# Patient Record
Sex: Female | Born: 1940
Health system: Southern US, Community
[De-identification: ages and names within clinical notes are randomized; demographics above are authoritative.]

## PROBLEM LIST (undated history)

## (undated) DIAGNOSIS — F32A Depression, unspecified: Secondary | ICD-10-CM

## (undated) DIAGNOSIS — R51 Headache: Secondary | ICD-10-CM

## (undated) DIAGNOSIS — C801 Malignant (primary) neoplasm, unspecified: Secondary | ICD-10-CM

## (undated) DIAGNOSIS — M8580 Other specified disorders of bone density and structure, unspecified site: Secondary | ICD-10-CM

## (undated) DIAGNOSIS — D249 Benign neoplasm of unspecified breast: Secondary | ICD-10-CM

## (undated) DIAGNOSIS — F329 Major depressive disorder, single episode, unspecified: Secondary | ICD-10-CM

## (undated) DIAGNOSIS — E785 Hyperlipidemia, unspecified: Secondary | ICD-10-CM

## (undated) DIAGNOSIS — E039 Hypothyroidism, unspecified: Secondary | ICD-10-CM

## (undated) DIAGNOSIS — R519 Headache, unspecified: Secondary | ICD-10-CM

## (undated) HISTORY — PX: TOTAL HIP ARTHROPLASTY: SHX124

## (undated) HISTORY — PX: HEMORROIDECTOMY: SUR656

## (undated) HISTORY — PX: ABDOMINAL HYSTERECTOMY: SHX81

## (undated) HISTORY — PX: CATARACT EXTRACTION: SUR2

## (undated) HISTORY — DX: Major depressive disorder, single episode, unspecified: F32.9

## (undated) HISTORY — PX: CHOLECYSTECTOMY: SHX55

## (undated) HISTORY — DX: Depression, unspecified: F32.A

## (undated) HISTORY — PX: THYROIDECTOMY: SHX17

## (undated) HISTORY — PX: JOINT REPLACEMENT: SHX530

## (undated) HISTORY — DX: Other specified disorders of bone density and structure, unspecified site: M85.80

## (undated) HISTORY — DX: Hyperlipidemia, unspecified: E78.5

---

## 2000-07-17 ENCOUNTER — Encounter: Payer: Self-pay | Admitting: Family Medicine

## 2000-07-17 ENCOUNTER — Ambulatory Visit (HOSPITAL_COMMUNITY): Admission: RE | Admit: 2000-07-17 | Discharge: 2000-07-17 | Payer: Self-pay | Admitting: Family Medicine

## 2002-08-18 ENCOUNTER — Ambulatory Visit (HOSPITAL_COMMUNITY): Admission: RE | Admit: 2002-08-18 | Discharge: 2002-08-18 | Payer: Self-pay | Admitting: Family Medicine

## 2002-08-18 ENCOUNTER — Encounter: Payer: Self-pay | Admitting: Family Medicine

## 2002-08-22 ENCOUNTER — Encounter: Payer: Self-pay | Admitting: Emergency Medicine

## 2002-08-22 ENCOUNTER — Emergency Department (HOSPITAL_COMMUNITY): Admission: EM | Admit: 2002-08-22 | Discharge: 2002-08-22 | Payer: Self-pay | Admitting: Emergency Medicine

## 2004-03-20 ENCOUNTER — Other Ambulatory Visit: Admission: RE | Admit: 2004-03-20 | Discharge: 2004-03-20 | Payer: Self-pay | Admitting: Family Medicine

## 2005-07-22 ENCOUNTER — Ambulatory Visit: Payer: Self-pay | Admitting: Physical Medicine & Rehabilitation

## 2005-07-22 ENCOUNTER — Inpatient Hospital Stay (HOSPITAL_COMMUNITY): Admission: EM | Admit: 2005-07-22 | Discharge: 2005-07-27 | Payer: Self-pay | Admitting: Emergency Medicine

## 2005-07-23 ENCOUNTER — Encounter (INDEPENDENT_AMBULATORY_CARE_PROVIDER_SITE_OTHER): Payer: Self-pay | Admitting: *Deleted

## 2005-07-27 ENCOUNTER — Inpatient Hospital Stay (HOSPITAL_COMMUNITY)
Admission: RE | Admit: 2005-07-27 | Discharge: 2005-08-04 | Payer: Self-pay | Admitting: Physical Medicine & Rehabilitation

## 2006-07-19 ENCOUNTER — Encounter: Admission: RE | Admit: 2006-07-19 | Discharge: 2006-07-19 | Payer: Self-pay | Admitting: Specialist

## 2007-08-29 ENCOUNTER — Other Ambulatory Visit: Admission: RE | Admit: 2007-08-29 | Discharge: 2007-08-29 | Payer: Self-pay | Admitting: Family Medicine

## 2010-02-14 ENCOUNTER — Emergency Department (HOSPITAL_BASED_OUTPATIENT_CLINIC_OR_DEPARTMENT_OTHER): Admission: EM | Admit: 2010-02-14 | Discharge: 2010-02-14 | Payer: Self-pay | Admitting: Emergency Medicine

## 2010-02-14 ENCOUNTER — Ambulatory Visit: Payer: Self-pay | Admitting: Diagnostic Radiology

## 2010-10-31 NOTE — Op Note (Signed)
NAMEARMINE, RIZZOLO                 ACCOUNT NO.:  [id number]   MEDICAL RECORD NO.:  [id number]          PATIENT TYPE:  INP   LOCATION:  5024                         FACILITY:  [facility]   PHYSICIAN:  Jene Every, M.D.    DATE OF BIRTH:  [birth year]   DATE OF PROCEDURE:  DATE OF DISCHARGE:                                 OPERATIVE REPORT   Audio too short to transcribe (less than 5 seconds)      Jene Every, M.D.     Cordelia Pen  D:  07/23/2005  T:  07/23/2005  Job:  [id number]

## 2010-10-31 NOTE — Discharge Summary (Signed)
[name], [name] NO.:  [id number]   MEDICAL RECORD NO.:  [id number]          PATIENT TYPE:  IPS   LOCATION:  4010                         FACILITY:  [facility]   PHYSICIAN:  [name], M.D.   DATE OF BIRTH:  [birth year]   DATE OF ADMISSION:  07/27/2005  DATE OF DISCHARGE:  08/04/2005                                 DISCHARGE SUMMARY   DISCHARGE DIAGNOSES:  1.  Left femoral neck fracture status post hemiarthroplasty on July 23, 2005.  2.  Pain management.  3.  Coumadin for deep venous thrombosis prophylaxis.  4.  Postoperative anemia.  5.  Depression.  6.  Hypothyroidism.   HISTORY OF PRESENT ILLNESS:  The patient is a 70 year old white female  admitted July 22, 2005 after a fall without loss of consciousness,  sustaining a left femoral neck fracture.  Underwent left hip  hemiarthroplasty July 23, 2005 per Dr. Shelle Shelton.  Placed on Coumadin for  deep venous thrombosis prophylaxis, 50% partial weightbearing.  Postoperative anemia, 8.5; transfused 2 units of packed red blood cells on  July 26, 2005, with hemoglobin improved to 11.1.  No chest pain, no  nausea or vomiting.  The patient was admitted to inpatient rehabilitation  services.   PAST MEDICAL HISTORY:  See discharge diagnoses.   SOCIAL HISTORY:  No alcohol or tobacco.  She lives alone.  Works as a  Publishing copy.   ALLERGIES:  SULFA.   PLAN:  Home with [name] in [place] on discharge.  [name] works part-  time.   MEDICATIONS PRIOR TO ADMISSION:  1.  Prozac 20 mg daily.  2.  Trazodone 50 mg daily.  3.  Wellbutrin 300 mg daily.  4.  Thyroid 112 mcg daily.  5.  Premarin 0.45 mg daily.   HOSPITAL COURSE:  The patient was admitted to inpatient rehabilitation  services, with therapies initiated on a b.i.d. basis, consisting of physical  therapy, occupational therapy, and rehabilitation nursing.  The following  issues were addressed during the patient's rehabilitation  stay.  Pertaining  to [name]'s left femoral neck fracture, hip hemiarthroplasty July 23, 2005 per Dr. Shelle Shelton.  Surgical site healing nicely.  No signs of infection.  She continued to follow her posterior hip precautions.  She was 50% partial  weightbearing.  She remained on Coumadin for deep venous thrombosis  prophylaxis, with latest INR of 1.7 and monitored.  Her calfs remained cool,  without any swelling, erythema, and nontender.  She remained on oxycodone as  well as Robaxin for pain management, with good results.  Postoperative  anemia stable, with latest hemoglobin of 11.4, hematocrit 33.8.  She did  have a documented history of depression.  She remained on her Prozac,  Wellbutrin, and Trazodone.  She remained upbeat, cooperative with staff,  resting well at night.  Had a good appetite.  She continued on hormone  supplement for hypothyroidism.  Followup maintenance thyroid panel was  pending.  Overall for her functional status, she was ambulating extended  household distances with a walker.  Essentially independent to standby  assist in all areas of activities of daily living, addressing grooming and  homemaking.  Overall, her strength and endurance greatly improved.  She was  encouraged with her overall progress.  OxyContin sustained release had been  added to her regimen and weaned over a one-week period at the time of  discharge.  She was discharged in stable condition.   DISCHARGE MEDICATIONS:  1.  Coumadin 6 mg daily, with dose adjusted accordingly.  She would remain      on Coumadin until August 20, 2005 and stop.  Followed by home health      agency.  2.  Premarin 0.45 mg daily.  3.  Prozac 20 mg daily.  4.  Wellbutrin 300 mg daily.  5.  Synthroid 112 mcg daily.  6.  Trinsicon one capsule twice daily.  7.  Desyrel 50 mg at bedtime.  8.  OxyContin sustained release, 10 mg Shelton 12 hours x1 week and then stop.  9.  Oxycodone immediate release for breakthrough pain.   10. Os-Cal 1000 mg daily.  11. Robaxin 500 mg Shelton six hours as needed.   ACTIVITY:  Partial weightbearing with posterior hip precautions.   DIET:  Regular.   WOUND CARE:  Cleanse incision daily with warm soap and water.   SPECIAL INSTRUCTIONS:  Home health nurse to complete Coumadin protocol.  Home health, physical, and occupational therapy.   FOLLOW UP:  Follow up with Dr. Shelle Shelton in orthopedic services.  Call for  appointment.      [name], P.A.    ______________________________  [name], M.D.    DA/MEDQ  D:  08/03/2005  T:  08/03/2005  Job:  [id number]   cc:   [name], M.D.  Fax: [phone]   [name], M.D.  Fax: [phone]   [name], M.D.  Fax: [phone]

## 2010-10-31 NOTE — H&P (Signed)
[name], [name] NO.:  [id number]   MEDICAL RECORD NO.:  [id number]          PATIENT TYPE:  IPS   LOCATION:  4010                         FACILITY:  [facility]   PHYSICIAN:  Ranelle Oyster, M.D.DATE OF BIRTH:  1940-07-08   DATE OF ADMISSION:  07/27/2005  DATE OF DISCHARGE:                                HISTORY & PHYSICAL   CHIEF COMPLAINT:  Left femoral neck fracture.   HISTORY OF PRESENT ILLNESS:  This is a 70 year old white female status post  fall on February 7 where she sustained a left femoral neck fracture.  Patient underwent a left hip hemiarthroplasty on February 8 by Dr. Shelle Iron.  She was placed on Coumadin for DVT prophylaxis.  She was transfused 2 units  postoperatively for hemoglobin 8.5.  INR was 5.4 today.  Coumadin has been  held.  She is admitted to inpatient rehabilitation today for further  functional progress.   REVIEW OF SYSTEMS:  Positive for insomnia and depression.  Full review of  systems is in the written history and physical.   PAST MEDICAL HISTORY:  1.  Depression.  2.  Hyperthyroidism.   FAMILY HISTORY:  Positive for coronary artery disease.   SOCIAL HISTORY:  Patient lives alone and works as a Control and instrumentation engineer person.  She will go home with her daughter in [place] upon discharge.  The  daughter works part-time.  The patient was completely independent prior to  arrival.   MEDICATIONS PRIOR TO ARRIVAL:  Prozac, trazodone, Wellbutrin, thyroid,  Premarin.   LABORATORIES:  Hemoglobin 11.1, white count 8.3, platelets 321.  Sodium 136,  potassium 3.4, BUN and creatinine 5 and 0.8, INR 5.4 today.   PHYSICAL EXAMINATION:  VITAL SIGNS:  Blood pressure 116/63, pulse 84,  respiratory rate 20, temperature 98.2.  GENERAL:  Patient is pleasant, in no acute distress.  She is alert and  oriented x3.  HEENT:  Unremarkable.  Oral mucosa is pink and moist.  Pupils are equal,  round, and reactive to light and accommodation.  Extraocular  movements are  intact.  NECK:  Supple without JVD and lymphadenopathy.  CHEST:  Clear to auscultation bilaterally without wheezes, rales, and  rhonchi.  HEART:  Regular rate and rhythm without murmurs, rubs, or gallops.  ABDOMEN:  Soft, nontender.  Bowel sounds are positive.  NEUROLOGIC:  Cranial nerves II-XII are intact.  Reflexes are 2+.  Sensation  is normal.  Judgment, orientation, memory, and mood are all within normal  limits.  Motor function is 5/5 in the upper extremities.  Left lower  extremity is 2/5 proximally to 4/5 distally.  Right lower extremity is 4/5  to 5/5 throughout.  Bilateral calves are nontender.  Legs are non-swollen  distally.  Left hip is clean and intact with minimal serosanguineous  discharge.   IMPRESSION/PLAN:  1.  Functional deficit secondary to left femoral neck fracture and hip      hemiarthroplasty postoperative day #4.  Begin comprehensive inpatient      rehabilitation with PT to assess for mobility, lower extremity strength      and balance and  OT will assess for ADLs and upper extremity use.      Nursing will follow for medications.  Bowel, bladder, skin management.      Nurse/case manager/social worker will assess for psychosocial needs.      Estimated length of stay is seven days.  Prognosis is good.  Goals are      modified independent.  2.  Pain management.  Oxycodone IR p.r.n.  Robaxin p.r.n.  3.  DVT prophylaxis.  Hold Coumadin today and follow-up INR in a.m.  4.  Postoperative anemia.  Follow-up CBC.  5.  Depression.  Prozac, Wellbutrin, trazodone.  6.  Hypothyroidism.  Synthroid.  7.  Bowels/constipation.  Sorbitol today with daily Senokot S ordered and      p.r.n. Dulcolax ordered as well.      Ranelle Oyster, M.D.  Electronically Signed     ZTS/MEDQ  D:  07/27/2005  T:  07/27/2005  Job:  [id number]

## 2010-10-31 NOTE — Discharge Summary (Signed)
NAMEJACKIE, [name]                 ACCOUNT NO.:  [id number]   MEDICAL RECORD NO.:  [id number]          PATIENT TYPE:  INP   LOCATION:  5732                         FACILITY:  [facility]   PHYSICIAN:  Jene Every, M.D.    DATE OF BIRTH:  [birth year]   DATE OF ADMISSION:  07/22/2005  DATE OF DISCHARGE:  07/27/2005                                 DISCHARGE SUMMARY   ADMISSION DIAGNOSES:  1.  Left femoral neck fracture.  2.  History of papillary cancer.  3.  Thyroidectomy.  4.  Depression.   DISCHARGE DIAGNOSES:  1.  Left femoral neck fracture.  2.  History of papillary cancer.  3.  Thyroidectomy.  4.  Depression.  5.  Status post left hip hemiarthroplasty.   PROCEDURE:  The patient was taken to the OR on July 24, 2005 to undergo a  left hip hemiarthroplasty.   SURGEON:  Jene Every, M.D.   ASSISTANT:  Roma Schanz, P.A.-C.   ANESTHESIA:  General.   COMPLICATIONS:  None.   HISTORY:  Ms. Ron is a pleasant 70 year old female who had a fall at her  home.  She was brought by EMS to [facility] Emergency Room where x-rays did  reveal left femoral neck fracture.  She was evaluated by Dr. Shelle Iron in the  emergency room and it was felt that she would need a left hip  hemiarthroplasty.  The patient was admitted for preoperative workup.  The  risks and benefits of the surgery were discussed with the patient as well as  her family and they did agree to proceed.  Preoperative labs were obtained.  CBC shows an elevated white count of 13.0, hemoglobin 12.8, hematocrit 38.2.  These are monitored throughout the hospital course.  The patient did drop to  a lowest of 8.5, hematocrit of 25.2.  However at time of discharge to rehab,  hemoglobin had improved to the 11.1, hematocrit was 32.5.  Coagulation done  preoperatively showed PT 12.5, INR 0.9.  These were followed throughout the  hospital course.  Patient did have a quick response to Coumadin.  At time of  discharge to rehab, PT was  49.1, INR 5.4.  Routine chemistries obtained  preoperatively show sodium 136, potassium 4.0, slightly elevated glucose of  120.  These are followed throughout the hospital course.  Patient's sodium  did drop to the level of 128, but at the time of discharge to rehab had  resolved to level of 136, potassium at the time of discharge was 3.4.  Routine liver function tests were normal.  Preoperative urinalysis was  remarkable for leukocyte esterase is 0-2 WBC seen per high power field.  There were numerous bacteria.  Blood type is O positive.  I do not see  preoperative chest x-ray or EKG listed in the chart.   HOSPITAL COURSE:  The patient was admitted and underwent left hip  hemiarthroplasty without difficulty.  She was then transferred to the PICU  and then to the recovery room and then to the orthopedic floor for continued  postoperative care.  Patient did fairly well postoperatively.  She did have  some issues with pain control.  Patient was placed on Coumadin for DVT  prophylaxis.  Discharge planning was initiated.  PT/OT was consulted.  Patient did develop some postoperative anemia, mildly symptomatic.  This was  monitored.  Incision remained clean and dry and packed throughout the  hospital stay.  Lovenox was began on postoperative day #2 until INR was  therapeutic.  Patient was transfused on postoperative day #3, 2 units of  packed red blood cells.  On that, she did note significant improvement.  She  did feel much stronger.  She continued to advanced very slowly with physical  therapy.  Patient's INR did spike and Lovenox withheld.  On postoperative  day #5, she was transferred to the rehab unit.   DISPOSITION:  Patient transferred to rehab unit for continued management for  multiple medical needs as well as physical therapy.  She is to have followup  with Dr. Shelle Iron in approximately 2 weeks for suture removal as well as x-  rays.   DISCHARGE MEDICATIONS:  As per rehab.  Continue  followup on her hemoglobin  as well as close monitoring of her Coumadin levels.   CONDITION ON DISCHARGE:  Stable.   FINAL DIAGNOSIS:  Status post left hip hemiarthroplasty.      Roma Schanz, P.A.      Jene Every, M.D.  Electronically Signed    CS/MEDQ  D:  01/01/2006  T:  01/01/2006  Job:  [id number]

## 2010-10-31 NOTE — Op Note (Signed)
NAMETAKIMA, ENCINA NO.:  [id number]   MEDICAL RECORD NO.:  [id number]          PATIENT TYPE:  INP   LOCATION:  5024                         FACILITY:  [facility]   PHYSICIAN:  Jene Every, M.D.    DATE OF BIRTH:  [birth year]   DATE OF PROCEDURE:  07/23/2005  DATE OF DISCHARGE:                                 OPERATIVE REPORT   PREOPERATIVE DIAGNOSIS:  Displaced femoral neck fracture.   POSTOPERATIVE DIAGNOSIS:  Displaced femoral neck fracture.   OPERATION PERFORMED:  Left hip hemiarthroplasty utilizing the DePuy Prodigy  13.5 mm stem, 44 mm bipolar assembly, short neck.   SURGEON:  Jene Every, M.D.   ASSISTANT:  Roma Schanz, P.A.   ANESTHESIA:  General.   INDICATIONS FOR PROCEDURE:  This is a 70 year old female who was seen  yesterday following a fall onto her left hip sustaining a displaced femoral  neck fracture.  The displaced femoral neck fracture was not viable and we  discussed with her the hemiarthroplasty procedure being required.  She had  radiographs demonstrating good acetabular space, therefore, did not feel a  total hip replacement was indicated.  The patient was otherwise healthy.  I  discussed the risks and benefits of the procedure including bleeding,  infection, damage to neurovascular structures, suboptimal range of motion,  dislocation, need for revision, persistent thigh pain, hardware failure,  anesthetic complications, DVT, PE, etc, and need for anticoagulation as well  as possibility of leg length discrepancy.   DESCRIPTION OF PROCEDURE:  Placed supine position.  After induction of  adequate anesthesia and 1 g Kefzol, she was placed in the right lateral  decubitus position.  All bony prominences were well padded.  The down leg  was padded and gently flexed and secured.  We utilized the hip restrainer.  The left lower extremity and the hip was prepped and draped in the usual  sterile fashion.  Posterolateral approach to the  hip was made with an  incision based over the trochanter through the skin only.  Subcutaneous  tissue was dissected.  Electrocautery utilized to achieved hemostasis.  Fascia lata was identified and divided in line with skin incision.  The  gluteus was separated a small portion away from the tip of the trochanter.  The patient had fair subcutaneous adipose tissue. Therefore will require the  utilization of the Charnley.  I placed a wet sponge over the soft tissue to  protect it, utilization of the Charnley.  This was kept underneath the  fascia lata only.  Protecting the sciatic nerve.  We then performed an  adductor tenotomy.  We identified the external rotators.  They were tagged,  detached from their origin and gently swept posteriorly.  Soft tissues  protected at all times with a Deaver.  We then performed a T-shaped  capsulotomy.  Fair amount of fracture hematoma was expressed.  These  leaflets were tagged.  The hip was dislocated.  The ball removed.  It  measured to a 44.  I inspected the fracture.  There appeared to be no  evidence of a pathologic lesion  though she did have a history of thyroid  cancer and we sent that for pathology in any event.  There was a fracture  that had extended to the neck proximally to the ball.  I used a Leksell  rongeur to remove the lateral aspect of the cortex of the femoral neck in  concert with the initiator and a box chisel in the appropriate version to  lateralize the greater trochanter.  Broach entered the canal with a T-handle  reamer, placed the broach, a 10 broach, so I could lateralize the greater  trochanter.  At one fingerbreadth above the lesser trochanter, completed the  osteotomy with an oscillating saw.  The fracture itself was near at this  level at least anteriorly.  Calcar appeared to be intact.  With the  appropriate lateralization, the appropriate version of the femur, we reamed  to a 13 mm diameter.  At the 13 mm diameter there was a  bit of cortical bone  obtained on the reamer.  It was felt then that a 13.5 would be optimal.  The  femur was then sequentially broached to a 13.5 then used a calcar reamer to  ream the calcar.  This was then removed.  We attempted a trial.  Having some  difficulty reducing the trial.  Therefore extended the skin incision  somewhat and performed a slight tenotomy of the gluteus insertion with the  sciatic nerve tissues well protected.  That in concert with the addition of  muscle relaxing was then satisfactory.  I obtained the permanent prosthesis  13.5 Prodigy porous coated impacting the appropriate version with excellent  scratch fit obtained.  This was to the level of the calcar.  Then a trial  +044 was found to reduce, be stable through out a full range.  Obtained full  extension with the knee flexed internal-external rotation without  dislocation.  There was no instability noted.  Leg lengths were appropriate.  Then redislocated and obtained the permanent bipolar assembly.  Cleaned the  trunnion and impacted bipolar assembly upon the neck.  Again with the soft  tissues well protected we reduced the hip. It was found to be stable  throughout a full range.  She had a full extension.  Good flexion with  internal rotation without dislocation.  Prior to this, we had copiously  irrigated with antibiotic irrigation, pulsatile lavage throughout the case.  This was done in the canal into the acetabulum.  We had inspected the  acetabulum prior to the reduction. It was debrided, there was good  cartilage.  There was no evidence of osteoarthritis.  We achieved strict  hemostasis for that.  Next we inspected, no evidence of active bleeding.  Repaired the adductor tenotomy with #1 Vicryl interrupted figure-of-eight  sutures.  Unable to repair the external rotators.  These were then released.  Palpated the sciatic nerve again, it was intact.  With the Deaver protecting the tissues repaired the  capsule with #1 Ethibond interrupted figure-of-  eight sutures.  Again no active bleeding and therefore repaired the fascia  lata with the leg supported, #1 Vicryl interrupted figure-of-eight sutures.  Subcutaneous tissue was approximately multiple layers of 0 and 2-0 Vicryl  simple sutures.  Skin was reapproximated with staples again with no active  bleeding.  We irrigated multiple layers throughout the closure.  Wound was  dressed sterilely.  Patient was placed therefore supine on the hospital bed.  Leg lengths were measured as equivalent, extubated without difficulty,  transported to  the recovery room in satisfactory condition.   The patient tolerated the procedure well.  No complications.  Blood loss 200  mL.      Jene Every, M.D.  Electronically Signed     JB/MEDQ  D:  07/23/2005  T:  07/23/2005  Job:  [id number]

## 2011-04-10 ENCOUNTER — Other Ambulatory Visit (HOSPITAL_BASED_OUTPATIENT_CLINIC_OR_DEPARTMENT_OTHER): Payer: Self-pay | Admitting: Family Medicine

## 2011-04-10 DIAGNOSIS — Z1231 Encounter for screening mammogram for malignant neoplasm of breast: Secondary | ICD-10-CM

## 2011-04-14 ENCOUNTER — Ambulatory Visit (HOSPITAL_BASED_OUTPATIENT_CLINIC_OR_DEPARTMENT_OTHER)
Admission: RE | Admit: 2011-04-14 | Discharge: 2011-04-14 | Disposition: A | Discharge status: Home / Self Care | Payer: Medicare Other | Source: Ambulatory Visit | Attending: Family Medicine | Admitting: Family Medicine

## 2011-04-14 DIAGNOSIS — Z1231 Encounter for screening mammogram for malignant neoplasm of breast: Secondary | ICD-10-CM

## 2011-04-22 ENCOUNTER — Other Ambulatory Visit: Payer: Self-pay | Admitting: Family Medicine

## 2011-04-22 DIAGNOSIS — R928 Other abnormal and inconclusive findings on diagnostic imaging of breast: Secondary | ICD-10-CM

## 2011-04-23 ENCOUNTER — Ambulatory Visit
Admission: RE | Admit: 2011-04-23 | Discharge: 2011-04-23 | Disposition: A | Discharge status: Home / Self Care | Payer: Medicare Other | Source: Ambulatory Visit | Attending: Family Medicine | Admitting: Family Medicine

## 2011-04-23 DIAGNOSIS — R928 Other abnormal and inconclusive findings on diagnostic imaging of breast: Secondary | ICD-10-CM

## 2011-05-15 ENCOUNTER — Other Ambulatory Visit: Payer: Self-pay | Admitting: Family Medicine

## 2011-05-15 DIAGNOSIS — N63 Unspecified lump in unspecified breast: Secondary | ICD-10-CM

## 2011-05-26 ENCOUNTER — Ambulatory Visit
Admission: RE | Admit: 2011-05-26 | Discharge: 2011-05-26 | Disposition: A | Discharge status: Home / Self Care | Payer: Medicare Other | Source: Ambulatory Visit | Attending: Family Medicine | Admitting: Family Medicine

## 2011-05-26 ENCOUNTER — Other Ambulatory Visit: Payer: Self-pay | Admitting: Family Medicine

## 2011-05-26 DIAGNOSIS — N63 Unspecified lump in unspecified breast: Secondary | ICD-10-CM

## 2011-07-21 ENCOUNTER — Emergency Department (HOSPITAL_COMMUNITY): Payer: Medicare Other

## 2011-07-21 ENCOUNTER — Encounter (HOSPITAL_COMMUNITY): Payer: Self-pay | Admitting: Emergency Medicine

## 2011-07-21 ENCOUNTER — Other Ambulatory Visit: Payer: Self-pay

## 2011-07-21 ENCOUNTER — Emergency Department (HOSPITAL_COMMUNITY)
Admission: EM | Admit: 2011-07-21 | Discharge: 2011-07-21 | Disposition: A | Discharge status: Home / Self Care | Payer: Medicare Other | Attending: Emergency Medicine | Admitting: Emergency Medicine

## 2011-07-21 DIAGNOSIS — Z7982 Long term (current) use of aspirin: Secondary | ICD-10-CM | POA: Insufficient documentation

## 2011-07-21 DIAGNOSIS — R0789 Other chest pain: Secondary | ICD-10-CM | POA: Insufficient documentation

## 2011-07-21 DIAGNOSIS — M79609 Pain in unspecified limb: Secondary | ICD-10-CM | POA: Insufficient documentation

## 2011-07-21 DIAGNOSIS — Z79899 Other long term (current) drug therapy: Secondary | ICD-10-CM | POA: Insufficient documentation

## 2011-07-21 DIAGNOSIS — M25519 Pain in unspecified shoulder: Secondary | ICD-10-CM | POA: Insufficient documentation

## 2011-07-21 DIAGNOSIS — E039 Hypothyroidism, unspecified: Secondary | ICD-10-CM | POA: Insufficient documentation

## 2011-07-21 HISTORY — DX: Hypothyroidism, unspecified: E03.9

## 2011-07-21 LAB — POCT I-STAT, CHEM 8
BUN: 21 mg/dL (ref 6–23)
Calcium, Ion: 1.18 mmol/L (ref 1.12–1.32)
Chloride: 107 mEq/L (ref 96–112)
Glucose, Bld: 82 mg/dL (ref 70–99)

## 2011-07-21 LAB — CBC
HCT: 38 % (ref 36.0–46.0)
Platelets: 266 10*3/uL (ref 150–400)
RBC: 4.33 MIL/uL (ref 3.87–5.11)

## 2011-07-21 LAB — DIFFERENTIAL
Basophils Relative: 0 % (ref 0–1)
Eosinophils Absolute: 0.1 10*3/uL (ref 0.0–0.7)
Lymphs Abs: 2.7 10*3/uL (ref 0.7–4.0)
Neutro Abs: 3.4 10*3/uL (ref 1.7–7.7)
Neutrophils Relative %: 48 % (ref 43–77)

## 2011-07-21 LAB — BASIC METABOLIC PANEL
BUN: 20 mg/dL (ref 6–23)
CO2: 24 mEq/L (ref 19–32)
Creatinine, Ser: 1.06 mg/dL (ref 0.50–1.10)
Glucose, Bld: 81 mg/dL (ref 70–99)

## 2011-07-21 NOTE — ED Notes (Signed)
Pt states that she went to walk her dog tonight and afterwards had bilateral arm pain and chest tightness.  States chest tightness is minimal now (1/10) and arm pain is not as severe.  Took 3 full strength ASA at home.  Denies n/v/d.  No SOB.

## 2011-07-21 NOTE — ED Notes (Signed)
rx x 0, pt voiced understanding to f/u with pcp and return for worsening of condition.

## 2011-07-21 NOTE — ED Notes (Signed)
PT. REPORTS CHEST TIGHTNESS / BILATERAL SHOULDER AND ARM PAIN ONSET THIS EVENING , DENIES SOB , SLIGHT NAUSEA , TOOK 3 REG ASA PRIOR TO ARRIVAL.

## 2011-07-21 NOTE — ED Provider Notes (Signed)
History     CSN: [id number]  Arrival date & time 07/21/11  0113   First MD Initiated Contact with Patient 07/21/11 0153      Chief Complaint  Patient presents with  . Chest Pain    (Consider location/radiation/quality/duration/timing/severity/associated sxs/prior treatment) Patient is a 71 y.o. female presenting with chest pain. The history is provided by the patient.  Chest Pain Pertinent negatives for primary symptoms include no shortness of breath, no abdominal pain, no nausea and no vomiting.  Pertinent negatives for associated symptoms include no numbness and no weakness.    patient developed chest tightness and bilateral shoulder pain radiating to both arms this evening. She states she's getting ready for bed after a work. The pain is improved after taking aspirin. No fevers. No nausea vomiting diarrhea. No diaphoresis. No lightheadedness. No cough. She's not had pains like this before. She does not smoke.  Past Medical History  Diagnosis Date  . Hypothyroidism     Past Surgical History  Procedure Date  . Total hip arthroplasty   . Thyroidectomy     No family history on file.  History  Substance Use Topics  . Smoking status: Never Smoker   . Smokeless tobacco: Not on file  . Alcohol Use: No    OB History    Grav Para Term Preterm Abortions TAB SAB Ect Mult Living                  Review of Systems  Constitutional: Negative for activity change and appetite change.  HENT: Negative for neck stiffness.   Eyes: Negative for pain.  Respiratory: Negative for chest tightness and shortness of breath.   Cardiovascular: Positive for chest pain. Negative for leg swelling.  Gastrointestinal: Negative for nausea, vomiting, abdominal pain and diarrhea.  Genitourinary: Negative for flank pain.  Musculoskeletal: Negative for back pain.  Skin: Negative for rash.  Neurological: Negative for weakness, numbness and headaches.  Psychiatric/Behavioral: Negative for behavioral  problems.    Allergies  Sulfa antibiotics  Home Medications   Current Outpatient Rx  Name Route Sig Dispense Refill  . ALENDRONATE SODIUM 70 MG PO TABS Oral Take 70 mg by mouth every 7 (seven) days. Take with a full glass of water on an empty stomach.    . ASPIRIN 325 MG PO TABS Oral Take 975 mg by mouth once.    . BUPROPION HCL ER (XL) 300 MG PO TB24 Oral Take 300 mg by mouth daily.    Marland Kitchen CITALOPRAM HYDROBROMIDE 20 MG PO TABS Oral Take 20 mg by mouth daily.    Marland Kitchen ESTRADIOL 0.5 MG PO TABS Oral Take 0.5 mg by mouth daily.    Marland Kitchen LEVOTHYROXINE SODIUM 112 MCG PO TABS Oral Take 112 mcg by mouth daily.    . TRAZODONE HCL 50 MG PO TABS Oral Take 50 mg by mouth at bedtime.      BP 117/88  Pulse 73  Temp(Src) 97.7 F (36.5 C) (Oral)  Resp 14  SpO2 100%  Physical Exam  Nursing note and vitals reviewed. Constitutional: She is oriented to person, place, and time. She appears well-developed and well-nourished.  HENT:  Head: Normocephalic and atraumatic.  Eyes: EOM are normal. Pupils are equal, round, and reactive to light.  Neck: Normal range of motion. Neck supple.  Cardiovascular: Normal rate, regular rhythm and normal heart sounds.   No murmur heard. Pulmonary/Chest: Effort normal and breath sounds normal. No respiratory distress. She has no wheezes. She has no rales.  Abdominal: Soft. Bowel sounds are normal. She exhibits no distension. There is no tenderness. There is no rebound and no guarding.  Musculoskeletal: Normal range of motion.       Tenderness over bilateral trapezius  Neurological: She is alert and oriented to person, place, and time. No cranial nerve deficit.  Skin: Skin is warm and dry.  Psychiatric: She has a normal mood and affect. Her speech is normal.    ED Course  Procedures (including critical care time)  Labs Reviewed  BASIC METABOLIC PANEL - Abnormal; Notable for the following:    GFR calc non Af Amer 52 (*)    GFR calc Af Amer 60 (*)    All other  components within normal limits  CBC  DIFFERENTIAL  POCT I-STAT, CHEM 8  POCT I-STAT TROPONIN I   Dg Chest 2 View  07/21/2011  *RADIOLOGY REPORT*  Clinical Data: Chest pain  CHEST - 2 VIEW  Comparison: 02/14/2010  Findings: Stable left infrahilar scarring or atelectasis.  Coarse perihilar interstitial markings as before.  No focal infiltrate. No effusion.  No edema.  Heart size normal.  Vascular clips in the upper abdomen and at the thoracic inlet on the left.  Regional bones unremarkable.  IMPRESSION:  1.  Chronic interstitial changes as above.  No acute disease.  Original Report Authenticated By: Thora Lance III, M.D.     1. Chest pain   2. Shoulder pain      Date: 07/21/2011  Rate: 69  Rhythm: normal sinus rhythm  QRS Axis: normal  Intervals: normal  ST/T Wave abnormalities: normal  Conduction Disutrbances:none  Narrative Interpretation:   Old EKG Reviewed: unchanged    MDM  Patient developed bilateral shoulder pain and chest tightness. Pain is improved now. EKG is reassuring and enzymes are negative. X-ray does not show a cause. I doubt cardiac cause. it appears more musculoskeletal in the shoulders. She be discharged home.        Juliet Rude. Rubin Payor, MD 07/21/11 [phone]

## 2012-04-13 ENCOUNTER — Other Ambulatory Visit: Payer: Self-pay | Admitting: Family Medicine

## 2012-04-13 DIAGNOSIS — Z1231 Encounter for screening mammogram for malignant neoplasm of breast: Secondary | ICD-10-CM

## 2012-05-11 ENCOUNTER — Ambulatory Visit: Payer: Medicare Other

## 2012-05-18 ENCOUNTER — Ambulatory Visit
Admission: RE | Admit: 2012-05-18 | Discharge: 2012-05-18 | Disposition: A | Discharge status: Home / Self Care | Payer: Medicare Other | Source: Ambulatory Visit | Attending: Family Medicine | Admitting: Family Medicine

## 2012-05-18 DIAGNOSIS — Z1231 Encounter for screening mammogram for malignant neoplasm of breast: Secondary | ICD-10-CM

## 2013-03-20 ENCOUNTER — Encounter (INDEPENDENT_AMBULATORY_CARE_PROVIDER_SITE_OTHER): Payer: Self-pay | Admitting: General Surgery

## 2013-03-20 ENCOUNTER — Ambulatory Visit (INDEPENDENT_AMBULATORY_CARE_PROVIDER_SITE_OTHER): Payer: Medicare Other | Admitting: General Surgery

## 2013-03-20 VITALS — BP 110/68 | HR 72 | Temp 97.6°F | Resp 14 | Ht 65.0 in | Wt 148.4 lb

## 2013-03-20 DIAGNOSIS — K648 Other hemorrhoids: Secondary | ICD-10-CM

## 2013-03-20 NOTE — Patient Instructions (Addendum)
Use a laxative of choice to try to prevent constipation. Call if you have heavy bleeding.

## 2013-03-20 NOTE — Progress Notes (Signed)
Patient ID: [name], female   DOB: [birth year], 72 y.o.   MRN: [id number]  Chief Complaint  Patient presents with  . New Evaluation    eval hems    HPI [name] is a 72 y.o. female.   HPI  She is referred by Dr. Merri Brunette for further evaluation of a symptomatic hemorrhoid. She has had hemorrhoids for many years. There are times when it swells up and becomes painful. She does have a problem with chronic constipation. No significant bleeding.  Past Medical History  Diagnosis Date  . Hypothyroidism   . Depression   . Hyperlipidemia   . Osteopenia     Past Surgical History  Procedure Laterality Date  . Total hip arthroplasty    . Thyroidectomy    . Abdominal hysterectomy    . Cataract extraction      Family History  Problem Relation Age of Onset  . Cancer Paternal Aunt     breast    Social History History  Substance Use Topics  . Smoking status: Never Smoker   . Smokeless tobacco: Never Used  . Alcohol Use: No    Allergies  Allergen Reactions  . Sulfa Antibiotics Nausea Only    Current Outpatient Prescriptions  Medication Sig Dispense Refill  . buPROPion (WELLBUTRIN XL) 300 MG 24 hr tablet Take 300 mg by mouth daily.      . citalopram (CELEXA) 20 MG tablet Take 20 mg by mouth daily.      Marland Kitchen estradiol (ESTRACE) 0.5 MG tablet Take 0.5 mg by mouth daily.      Marland Kitchen levothyroxine (SYNTHROID, LEVOTHROID) 112 MCG tablet Take 112 mcg by mouth daily.      . traZODone (DESYREL) 50 MG tablet Take 50 mg by mouth at bedtime.      Marland Kitchen aspirin 325 MG tablet Take 975 mg by mouth once.       No current facility-administered medications for this visit.    Review of Systems Review of Systems  Constitutional: Negative.   Cardiovascular: Negative.   Gastrointestinal: Positive for constipation and rectal pain.  Genitourinary: Negative.     Blood pressure 110/68, pulse 72, temperature 97.6 F (36.4 C), temperature source Temporal, resp. rate 14, height 5\' 5"  (1.651 m),  weight 148 lb 6.4 oz (67.314 kg).  Physical Exam Physical Exam  Constitutional: She appears well-developed and well-nourished. No distress.  HENT:  Head: Normocephalic and atraumatic.  Genitourinary:  Anorectal:  There are no external swellings or lesions. No fissure noted. On digital rectal exam there is a fullness noted in the right posterior region.  Anoscopy demonstrates a enlarged internal hemorrhoid in the right posterior position that when the anoscope was removed prolapses. It is easily reducible.  After discussion with her, rubber band ligation was performed which she tolerated well.    Data Reviewed Note from Dr. Katrinka Blazing   Assessment    Prolapsing internal hemorrhoid-rubber band ligation performed.     Plan    Return visit 3 weeks. If it did not respond her band ligation and I told her outpatient surgery would be the next If she wanted to get rid of the hemorrhoid.        Zacharia Sowles J 03/20/2013, 9:56 AM

## 2013-04-12 ENCOUNTER — Ambulatory Visit (INDEPENDENT_AMBULATORY_CARE_PROVIDER_SITE_OTHER): Payer: Medicare Other | Admitting: General Surgery

## 2013-04-12 ENCOUNTER — Encounter (INDEPENDENT_AMBULATORY_CARE_PROVIDER_SITE_OTHER): Payer: Self-pay | Admitting: General Surgery

## 2013-04-12 ENCOUNTER — Encounter (INDEPENDENT_AMBULATORY_CARE_PROVIDER_SITE_OTHER): Payer: Medicare Other | Admitting: General Surgery

## 2013-04-12 VITALS — BP 108/62 | HR 76 | Temp 99.3°F | Resp 14 | Ht 65.0 in | Wt 145.8 lb

## 2013-04-12 DIAGNOSIS — K648 Other hemorrhoids: Secondary | ICD-10-CM

## 2013-04-12 NOTE — Progress Notes (Signed)
Subjective:     Patient ID: [name], female   DOB: 1941-04-27, 72 y.o.   MRN: [id number]  HPI  She's here for followup of her prolapsing internal hemorrhoids. She had rubber band ligation treatment. She feels much better. She's not having any further prolapsing or bleeding.   Review of Systems     Objective:   Physical Exam Anorectal-no external lesions. On digital rectal exam there's a small fullness present in the right posterior area.  Anoscopy demonstrates a significantly smaller internal hemorrhoid that is firm.    Assessment:     Internal prolapsing and bleeding hemorrhoids that's responded fairly well torubber band ligation. Symptoms have resolved. Hemorrhoid is still shrinking some.    Plan:     Increase fiber in diet. I do not feel repeat rubber band ligation is necessary at this time. Return if symptoms recur.

## 2013-04-12 NOTE — Patient Instructions (Signed)
Increase the raw fruits, raw vegetables, and grains in your diet.  Call if you have recurrent bleeding or prolapse.

## 2013-07-05 ENCOUNTER — Other Ambulatory Visit: Payer: Self-pay

## 2013-07-05 DIAGNOSIS — Z1231 Encounter for screening mammogram for malignant neoplasm of breast: Secondary | ICD-10-CM

## 2013-07-26 ENCOUNTER — Ambulatory Visit
Admission: RE | Admit: 2013-07-26 | Discharge: 2013-07-26 | Disposition: A | Discharge status: Home / Self Care | Payer: Medicare Other | Source: Ambulatory Visit

## 2013-07-26 DIAGNOSIS — Z1231 Encounter for screening mammogram for malignant neoplasm of breast: Secondary | ICD-10-CM

## 2014-10-19 ENCOUNTER — Other Ambulatory Visit: Payer: Self-pay

## 2014-10-19 DIAGNOSIS — Z1231 Encounter for screening mammogram for malignant neoplasm of breast: Secondary | ICD-10-CM

## 2014-10-23 ENCOUNTER — Ambulatory Visit
Admission: RE | Admit: 2014-10-23 | Discharge: 2014-10-23 | Disposition: A | Discharge status: Home / Self Care | Payer: PPO | Source: Ambulatory Visit

## 2014-10-23 DIAGNOSIS — Z1231 Encounter for screening mammogram for malignant neoplasm of breast: Secondary | ICD-10-CM

## 2014-10-24 ENCOUNTER — Other Ambulatory Visit: Payer: Self-pay | Admitting: Family Medicine

## 2014-10-24 DIAGNOSIS — R928 Other abnormal and inconclusive findings on diagnostic imaging of breast: Secondary | ICD-10-CM

## 2014-10-29 ENCOUNTER — Ambulatory Visit
Admission: RE | Admit: 2014-10-29 | Discharge: 2014-10-29 | Disposition: A | Discharge status: Home / Self Care | Payer: PPO | Source: Ambulatory Visit | Attending: Family Medicine | Admitting: Family Medicine

## 2014-10-29 DIAGNOSIS — R928 Other abnormal and inconclusive findings on diagnostic imaging of breast: Secondary | ICD-10-CM

## 2015-04-22 ENCOUNTER — Encounter (HOSPITAL_COMMUNITY): Payer: Self-pay | Admitting: Emergency Medicine

## 2015-04-22 ENCOUNTER — Emergency Department (HOSPITAL_COMMUNITY)
Admission: EM | Admit: 2015-04-22 | Discharge: 2015-04-22 | Disposition: A | Discharge status: Home / Self Care | Payer: PPO | Attending: Emergency Medicine | Admitting: Emergency Medicine

## 2015-04-22 DIAGNOSIS — Z8589 Personal history of malignant neoplasm of other organs and systems: Secondary | ICD-10-CM | POA: Diagnosis not present

## 2015-04-22 DIAGNOSIS — F329 Major depressive disorder, single episode, unspecified: Secondary | ICD-10-CM | POA: Diagnosis not present

## 2015-04-22 DIAGNOSIS — Z7982 Long term (current) use of aspirin: Secondary | ICD-10-CM | POA: Diagnosis not present

## 2015-04-22 DIAGNOSIS — E039 Hypothyroidism, unspecified: Secondary | ICD-10-CM | POA: Insufficient documentation

## 2015-04-22 DIAGNOSIS — R51 Headache: Secondary | ICD-10-CM | POA: Insufficient documentation

## 2015-04-22 DIAGNOSIS — H5711 Ocular pain, right eye: Secondary | ICD-10-CM | POA: Diagnosis present

## 2015-04-22 DIAGNOSIS — Z79899 Other long term (current) drug therapy: Secondary | ICD-10-CM | POA: Insufficient documentation

## 2015-04-22 DIAGNOSIS — Z8739 Personal history of other diseases of the musculoskeletal system and connective tissue: Secondary | ICD-10-CM | POA: Insufficient documentation

## 2015-04-22 DIAGNOSIS — H1131 Conjunctival hemorrhage, right eye: Secondary | ICD-10-CM | POA: Diagnosis not present

## 2015-04-22 DIAGNOSIS — Z79818 Long term (current) use of other agents affecting estrogen receptors and estrogen levels: Secondary | ICD-10-CM | POA: Insufficient documentation

## 2015-04-22 HISTORY — DX: Malignant (primary) neoplasm, unspecified: C80.1

## 2015-04-22 MED ORDER — PROPARACAINE HCL 0.5 % OP SOLN
1.0000 [drp] | Freq: Once | OPHTHALMIC | Status: AC
  Administered 2015-04-22: 1 [drp] via OPHTHALMIC
  Filled 2015-04-22: qty 15

## 2015-04-22 MED ORDER — FLUORESCEIN SODIUM 1 MG OP STRP
1.0000 | ORAL_STRIP | Freq: Once | OPHTHALMIC | Status: AC
  Administered 2015-04-22: 1 via OPHTHALMIC
  Filled 2015-04-22: qty 1

## 2015-04-22 NOTE — ED Notes (Signed)
Reported to ben-pa-c, woodslamp is at the bedside, patient is ready for exam. He acknowledges, no new orders.

## 2015-04-22 NOTE — ED Notes (Signed)
Pt. reports right eye pain with bleeding at right lower sclera onset this evening , denies injury / no vision loss.

## 2015-04-22 NOTE — ED Notes (Signed)
Ben, pa-c, at the bedside.

## 2015-04-22 NOTE — ED Notes (Signed)
Ben, pa-c, still at the bedside.

## 2015-04-22 NOTE — ED Notes (Signed)
Pt states sudden onset of pain in right eye. Obvious bleeding to outer sclera of right eye.

## 2015-04-22 NOTE — ED Notes (Signed)
Dr. Goldston at the bedside. 

## 2015-04-22 NOTE — ED Provider Notes (Signed)
CSN: [id number]     Arrival date & time 04/22/15  1920 History  By signing my name below, I, [facility], attest that this documentation has been prepared under the direction and in the presence of [employer], PA-C. Electronically Signed: Virgel Bouquet, ED Scribe. 04/22/2015. 11:42 PM.    Chief Complaint  Patient presents with  . Eye Pain    The history is provided by the patient. No language interpreter was used.   HPI Comments: [name] is a 74 y.o. female with prior SHx of cataracts and who presents to the Emergency Department complaining of improving, 5/10, sudden, sharp, constant right eye pain and redness onset 1 hour ago. Patient reports the complaint onset spontaneously while eating without trauma or known FB exposure.  Patient notes associated minimal right eye blurred vision and headache.  There are no aggravating factors including occular movement.  She is followed by opthamologist, Dr. Gershon Crane, who  examined patient 1 month ago without abnormal findings per patient. Patient reports no hx of DM or HLN.  Patient denies injury to eye, or any other complaints. No other aggravating or modifying factors.    Past Medical History  Diagnosis Date  . Hypothyroidism   . Depression   . Hyperlipidemia   . Osteopenia   . Papillary carcinoma [facility])    Past Surgical History  Procedure Laterality Date  . Total hip arthroplasty    . Thyroidectomy    . Abdominal hysterectomy    . Cataract extraction     Family History  Problem Relation Age of Onset  . Cancer Paternal Aunt     breast   Social History  Substance Use Topics  . Smoking status: Never Smoker   . Smokeless tobacco: Never Used  . Alcohol Use: No   OB History    No data available     Review of Systems  Eyes: Positive for pain, redness and visual disturbance.  Neurological: Positive for headaches.  All other systems reviewed and are negative.     Allergies  Sulfa antibiotics  Home  Medications   Prior to Admission medications   Medication Sig Start Date End Date Taking? Authorizing Provider  aspirin 325 MG tablet Take 975 mg by mouth once.    Historical Provider, MD  buPROPion (WELLBUTRIN XL) 300 MG 24 hr tablet Take 300 mg by mouth daily.    Historical Provider, MD  citalopram (CELEXA) 20 MG tablet Take 20 mg by mouth daily.    Historical Provider, MD  estradiol (ESTRACE) 0.5 MG tablet Take 0.5 mg by mouth daily.    Historical Provider, MD  levothyroxine (SYNTHROID, LEVOTHROID) 112 MCG tablet Take 112 mcg by mouth daily.    Historical Provider, MD  traZODone (DESYREL) 50 MG tablet Take 50 mg by mouth at bedtime.    Historical Provider, MD   BP 117/62 mmHg  Pulse 77  Temp(Src) 98 F (36.7 C) (Oral)  Resp 16  Ht 5' 4.5" (1.638 m)  Wt 141 lb 4 oz (64.071 kg)  BMI 23.88 kg/m2  SpO2 97% Physical Exam  Constitutional: She is oriented to person, place, and time. She appears well-developed and well-nourished. No distress.  HENT:  Head: Normocephalic and atraumatic.  Eyes: Conjunctivae and EOM are normal. Pupils are equal, round, and reactive to light.    Subconjunctival hemorrhage on the lateral aspect of the right eye. Pupils are equal round and reactive to light. No consensual pain. Extraocular movements intact without discomfort. No periorbital swelling or cellulitis.  On Woods lamp evaluation there is no corneal abrasion or dendritic lesions. On tonometry, R: 18, 19, 20    L: 17, 19, 20 Funduscopy is a limited exam, no obvious abnormalities noted.  Neck: Neck supple. No tracheal deviation present.  Cardiovascular: Normal rate.   Pulmonary/Chest: Effort normal. No respiratory distress.  Musculoskeletal: Normal range of motion.  Neurological: She is alert and oriented to person, place, and time.  Skin: Skin is warm and dry.  Psychiatric: She has a normal mood and affect. Her behavior is normal.  Nursing note and vitals reviewed.   ED Course  Procedures   EMERGENCY DEPARTMENT [place] OCULAR EXAM "Study: Limited Ultrasound of Orbit "  INDICATIONS: Painful vision  Linear probe utilized to obtain images in both long and short axis of the orbit having the patient look left and right if possible.  PERFORMED BY: Myself  IMAGES ARCHIVED?: Yes  LIMITATIONS: none  VIEWS USED: Right orbit  INTERPRETATION: No retinal detachment, Lens in proper position  DIAGNOSTIC STUDIES: Oxygen Saturation is 99% on RA, normal by my interpretation.    COORDINATION OF CARE: 8:03 PM Will numb and stain eye to detect for abrasions or tears. Discussed treatment plan with pt at bedside and pt agreed to plan.   Labs Review Labs Reviewed - No data to display  Imaging Review No results found. I have personally reviewed and evaluated these images and lab results as part of my medical decision-making.   EKG Interpretation None     Meds given in ED:  Medications  proparacaine (ALCAINE) 0.5 % ophthalmic solution 1 drop (1 drop Both Eyes Given 04/22/15 2051)  fluorescein ophthalmic strip 1 strip (1 strip Both Eyes Given 04/22/15 2051)    Discharge Medication List as of 04/22/2015 10:03 PM     Filed Vitals:   04/22/15 1933 04/22/15 2203  BP: 132/58 117/62  Pulse: 81 77  Temp: 98.1 F (36.7 C) 98 F (36.7 C)  TempSrc: Oral Oral  Resp: 22 16  Height: 5' 4.5" (1.638 m)   Weight: 141 lb 4 oz (64.071 kg)   SpO2: 99% 97%    MDM  Vitals stable - WNL -afebrile Pt resting comfortably in ED. PE--ocular exam as above. Consistent with subconjunctival hemorrhage. No other ocular abnormalities or emergencies noted. Ultrasound shows no retinal detachment, lens dislocation. Tonometry within normal limits, low suspicion for acute angle closure glaucoma. No evidence of corneal abrasion. Low suspicion for GCA as patient clarifies more eye pain and headache. She also reports pain resolves with administration of proparacaine. I believe symptoms and discomfort are likely  due to superficial process. Discussed with patient to follow-up with her ophthalmologist, Dr. Gershon Crane within the next 1-2 days for reevaluation. Patient verbalizes understanding and agrees with this plan. I discussed all relevant lab findings and imaging results with pt and they verbalized understanding. Discussed f/u with PCP within 48 hrs and return precautions, pt very amenable to plan. Prior to patient discharge, I discussed and reviewed this case with Dr. Regenia Skeeter, who also saw and evaluated the patient and agrees with above plan.    Final diagnoses:  Subconjunctival hemorrhage of right eye   I personally performed the services described in this documentation, which was scribed in my presence. The recorded information has been reviewed and is accurate.    Comer Locket, PA-C 04/22/15 [place], MD 04/24/15 [phone]

## 2015-04-22 NOTE — Discharge Instructions (Signed)
Please follow-up with your ophthalmologist within the next 2 days for reevaluation. Return to ED for any new or worsening symptoms including worsening eye pain, headaches, vision changes.  Subconjunctival Hemorrhage Subconjunctival hemorrhage is bleeding that happens between the white part of your eye (sclera) and the clear membrane that covers the outside of your eye (conjunctiva). There are many tiny blood vessels near the surface of your eye. A subconjunctival hemorrhage happens when one or more of these vessels breaks and bleeds, causing a red patch to appear on your eye. This is similar to a bruise. Depending on the amount of bleeding, the red patch may only cover a small area of your eye or it may cover the entire visible part of the sclera. If a lot of blood collects under the conjunctiva, there may also be swelling. Subconjunctival hemorrhages do not affect your vision or cause pain, but your eye may feel irritated if there is swelling. Subconjunctival hemorrhages usually do not require treatment, and they disappear on their own within two weeks. CAUSES This condition may be caused by:  Mild trauma, such as rubbing your eye too hard.  Severe trauma or blunt injuries.  Coughing, sneezing, or vomiting.  Straining, such as when lifting a heavy object.  High blood pressure.  Recent eye surgery.  A history of diabetes.  Certain medicines, especially blood thinners (anticoagulants).  Other conditions, such as eye tumors, bleeding disorders, or blood vessel abnormalities. Subconjunctival hemorrhages can happen without an obvious cause.  SYMPTOMS  Symptoms of this condition include:  A bright red or dark red patch on the white part of the eye.  The red area may spread out to cover a larger area of the eye before it goes away.  The red area may turn brownish-yellow before it goes away.  Swelling.  Mild eye irritation. DIAGNOSIS This condition is diagnosed with a physical exam.  If your subconjunctival hemorrhage was caused by trauma, your health care provider may refer you to an eye specialist (ophthalmologist) or another specialist to check for other injuries. You may have other tests, including:  An eye exam.  A blood pressure check.  Blood tests to check for bleeding disorders. If your subconjunctival hemorrhage was caused by trauma, X-rays or a CT scan may be done to check for other injuries. TREATMENT Usually, no treatment is needed. Your health care provider may recommend eye drops or cold compresses to help with discomfort. HOME CARE INSTRUCTIONS  Take over-the-counter and prescription medicines only as directed by your health care provider.  Use eye drops or cold compresses to help with discomfort as directed by your health care provider.  Avoid activities, things, and environments that may irritate or injure your eye.  Keep all follow-up visits as told by your health care provider. This is important. SEEK MEDICAL CARE IF:  You have pain in your eye.  The bleeding does not go away within 3 weeks.  You keep getting new subconjunctival hemorrhages. SEEK IMMEDIATE MEDICAL CARE IF:  Your vision changes or you have difficulty seeing.  You suddenly develop severe sensitivity to light.  You develop a severe headache, persistent vomiting, confusion, or abnormal tiredness (lethargy).  Your eye seems to bulge or protrude from your eye socket.  You develop unexplained bruises on your body.  You have unexplained bleeding in another area of your body.   This information is not intended to replace advice given to you by your health care provider. Make sure you discuss any questions you have  with your health care provider.   Document Released: 06/01/2005 Document Revised: 02/20/2015 Document Reviewed: 08/08/2014 Elsevier Interactive Patient Education [employer].

## 2015-06-16 HISTORY — PX: BREAST BIOPSY: SHX20

## 2015-06-16 HISTORY — PX: BREAST EXCISIONAL BIOPSY: SUR124

## 2015-08-15 DIAGNOSIS — F329 Major depressive disorder, single episode, unspecified: Secondary | ICD-10-CM | POA: Diagnosis not present

## 2015-08-15 DIAGNOSIS — E559 Vitamin D deficiency, unspecified: Secondary | ICD-10-CM | POA: Diagnosis not present

## 2015-08-15 DIAGNOSIS — E039 Hypothyroidism, unspecified: Secondary | ICD-10-CM | POA: Diagnosis not present

## 2015-08-15 DIAGNOSIS — N183 Chronic kidney disease, stage 3 (moderate): Secondary | ICD-10-CM | POA: Diagnosis not present

## 2015-08-15 DIAGNOSIS — G4700 Insomnia, unspecified: Secondary | ICD-10-CM | POA: Diagnosis not present

## 2015-10-16 DIAGNOSIS — E559 Vitamin D deficiency, unspecified: Secondary | ICD-10-CM | POA: Diagnosis not present

## 2015-11-26 DIAGNOSIS — M9903 Segmental and somatic dysfunction of lumbar region: Secondary | ICD-10-CM | POA: Diagnosis not present

## 2015-11-26 DIAGNOSIS — M542 Cervicalgia: Secondary | ICD-10-CM | POA: Diagnosis not present

## 2015-11-26 DIAGNOSIS — M545 Low back pain: Secondary | ICD-10-CM | POA: Diagnosis not present

## 2015-11-26 DIAGNOSIS — M9901 Segmental and somatic dysfunction of cervical region: Secondary | ICD-10-CM | POA: Diagnosis not present

## 2015-11-28 DIAGNOSIS — M9901 Segmental and somatic dysfunction of cervical region: Secondary | ICD-10-CM | POA: Diagnosis not present

## 2015-11-28 DIAGNOSIS — M542 Cervicalgia: Secondary | ICD-10-CM | POA: Diagnosis not present

## 2015-11-28 DIAGNOSIS — M545 Low back pain: Secondary | ICD-10-CM | POA: Diagnosis not present

## 2015-11-28 DIAGNOSIS — M9903 Segmental and somatic dysfunction of lumbar region: Secondary | ICD-10-CM | POA: Diagnosis not present

## 2015-12-15 DIAGNOSIS — R109 Unspecified abdominal pain: Secondary | ICD-10-CM | POA: Diagnosis not present

## 2015-12-27 ENCOUNTER — Other Ambulatory Visit: Payer: Self-pay | Admitting: Family Medicine

## 2015-12-27 DIAGNOSIS — Z1231 Encounter for screening mammogram for malignant neoplasm of breast: Secondary | ICD-10-CM

## 2016-01-03 ENCOUNTER — Ambulatory Visit
Admission: RE | Admit: 2016-01-03 | Discharge: 2016-01-03 | Disposition: A | Discharge status: Home / Self Care | Payer: PPO | Source: Ambulatory Visit | Attending: Family Medicine | Admitting: Family Medicine

## 2016-01-03 DIAGNOSIS — Z1231 Encounter for screening mammogram for malignant neoplasm of breast: Secondary | ICD-10-CM | POA: Diagnosis not present

## 2016-01-06 ENCOUNTER — Other Ambulatory Visit: Payer: Self-pay | Admitting: Family Medicine

## 2016-01-06 DIAGNOSIS — R928 Other abnormal and inconclusive findings on diagnostic imaging of breast: Secondary | ICD-10-CM

## 2016-01-10 ENCOUNTER — Other Ambulatory Visit: Payer: Self-pay | Admitting: Family Medicine

## 2016-01-10 ENCOUNTER — Ambulatory Visit
Admission: RE | Admit: 2016-01-10 | Discharge: 2016-01-10 | Disposition: A | Discharge status: Home / Self Care | Payer: PPO | Source: Ambulatory Visit | Attending: Family Medicine | Admitting: Family Medicine

## 2016-01-10 DIAGNOSIS — R928 Other abnormal and inconclusive findings on diagnostic imaging of breast: Secondary | ICD-10-CM

## 2016-01-10 DIAGNOSIS — R921 Mammographic calcification found on diagnostic imaging of breast: Secondary | ICD-10-CM | POA: Diagnosis not present

## 2016-01-15 ENCOUNTER — Ambulatory Visit
Admission: RE | Admit: 2016-01-15 | Discharge: 2016-01-15 | Disposition: A | Discharge status: Home / Self Care | Payer: PPO | Source: Ambulatory Visit | Attending: Family Medicine | Admitting: Family Medicine

## 2016-01-15 ENCOUNTER — Other Ambulatory Visit: Payer: Self-pay | Admitting: Family Medicine

## 2016-01-15 DIAGNOSIS — R928 Other abnormal and inconclusive findings on diagnostic imaging of breast: Secondary | ICD-10-CM

## 2016-01-15 DIAGNOSIS — D242 Benign neoplasm of left breast: Secondary | ICD-10-CM | POA: Diagnosis not present

## 2016-01-15 DIAGNOSIS — R921 Mammographic calcification found on diagnostic imaging of breast: Secondary | ICD-10-CM | POA: Diagnosis not present

## 2016-01-16 DIAGNOSIS — M9903 Segmental and somatic dysfunction of lumbar region: Secondary | ICD-10-CM | POA: Diagnosis not present

## 2016-01-16 DIAGNOSIS — M542 Cervicalgia: Secondary | ICD-10-CM | POA: Diagnosis not present

## 2016-01-16 DIAGNOSIS — M545 Low back pain: Secondary | ICD-10-CM | POA: Diagnosis not present

## 2016-01-16 DIAGNOSIS — M9901 Segmental and somatic dysfunction of cervical region: Secondary | ICD-10-CM | POA: Diagnosis not present

## 2016-01-29 ENCOUNTER — Ambulatory Visit: Payer: Self-pay | Admitting: General Surgery

## 2016-01-29 DIAGNOSIS — D242 Benign neoplasm of left breast: Secondary | ICD-10-CM

## 2016-01-29 NOTE — H&P (Signed)
[name] 01/29/2016 11:33 AM Location: [facility] Patient #: [id number] DOB: 12-Jun-1941 Single / Language: [name] / Race: White Female  History of Present Illness Emma Hollingshead MD; 01/29/2016 12:15 PM) The patient is a 75 year old female.   Note:She is referred by Dr. Ammie Shelton for consultation recurrent guarding a sclerosing intraductal papilloma of left breast. She had calcifications biopsied and left breast in 2012 which demonstrated fibrocystic change. Mammogram this year demonstrated increasing calcifications spanning 3 cm. Biopsy demonstrated the above pathology as well as usual ductal hyperplasia. Wider excision was recommended and she presents to discuss that. She states she feels a lump at the biopsy site. She feels one of her aunts had breast cancer but lived to be into her 84s.  Other Problems [name], CMA; 01/29/2016 11:33 AM) Cholelithiasis Depression Diverticulosis Hemorrhoids Kidney Stone Oophorectomy Right. Thyroid Cancer Thyroid Disease  Past Surgical History [name], CMA; 01/29/2016 11:33 AM) Cataract Surgery Bilateral. Gallbladder Surgery - Laparoscopic Hemorrhoidectomy Hip Surgery Left. Hysterectomy (not due to cancer) - Partial Oral Surgery Thyroid Surgery Tonsillectomy  Diagnostic Studies History [name], CMA; 01/29/2016 11:33 AM) Colonoscopy within last year Mammogram within last year Pap Smear 1-5 years ago  Allergies [name], CMA; 01/29/2016 11:34 AM) Sulfa Antibiotics  Medication History ([name], CMA; 01/29/2016 11:34 AM) Citalopram Hydrobromide (20MG  Tablet, Oral) Active. Levothyroxine Sodium (100MCG Tablet, Oral) Active. BuPROPion HCl ER (XL) (300MG  Tablet ER 24HR, Oral) Active. Vitamin D (Ergocalciferol) (50000UNIT Capsule, Oral) Active. ValACYclovir HCl (500MG  Tablet, Oral) Active. TraZODone HCl (50MG  Tablet, Oral) Active. Medications Reconciled  Social  History [name], CMA; 01/29/2016 11:33 AM) Alcohol use Occasional alcohol use. Caffeine use Coffee, Tea. No drug use Tobacco use Never smoker.  Family History [name], [name]; 01/29/2016 11:33 AM) Arthritis [name]. Cancer [name]. Diabetes Mellitus [name]. Heart Disease [name]. Prostate Cancer [name].  Pregnancy / Birth History [name], [name]; 01/29/2016 11:33 AM) Age at menarche 36 years. Gravida 2 Maternal age 39-25 Para 2     Review of Systems ([name]; 01/29/2016 11:33 AM) General Not Present- Appetite Loss, Chills, Fatigue, Fever, Night Sweats, Weight Gain and Weight Loss. Skin Not Present- Change in Wart/Mole, Dryness, Hives, Jaundice, New Lesions, Non-Healing Wounds, Rash and Ulcer. HEENT Present- Wears glasses/contact lenses. Not Present- Earache, Hearing Loss, Hoarseness, Nose Bleed, Oral Ulcers, Ringing in the Ears, Seasonal Allergies, Sinus Pain, Sore Throat, Visual Disturbances and Yellow Eyes. Breast Not Present- Breast Mass, Breast Pain, Nipple Discharge and Skin Changes. Cardiovascular Not Present- Chest Pain, Difficulty Breathing Lying Down, Leg Cramps, Palpitations, Rapid Heart Rate, Shortness of Breath and Swelling of Extremities. Gastrointestinal Present- Constipation. Not Present- Abdominal Pain, Bloating, Bloody Stool, Change in Bowel Habits, Chronic diarrhea, Difficulty Swallowing, Excessive gas, Gets full quickly at meals, Hemorrhoids, Indigestion, Nausea, Rectal Pain and Vomiting. Female Genitourinary Not Present- Frequency, Nocturia, Painful Urination, Pelvic Pain and Urgency. Musculoskeletal Not Present- Back Pain, Joint Pain, Joint Stiffness, Muscle Pain, Muscle Weakness and Swelling of Extremities. Neurological Not Present- Decreased Memory, Fainting, Headaches, Numbness, Seizures, Tingling, Tremor, Trouble walking and Weakness. Psychiatric Not Present- Anxiety, Bipolar, Change in Sleep Pattern, Depression, Fearful and Frequent  crying. Endocrine Not Present- Cold Intolerance, Excessive Hunger, Hair Changes, Heat Intolerance, Hot flashes and New Diabetes. Hematology Not Present- Blood Thinners, Easy Bruising, Excessive bleeding, Gland problems, HIV and Persistent Infections.  Vitals ([name] CMA; 01/29/2016 11:34 AM) 01/29/2016 11:33 AM Weight: 136 lb Height: 64in Body Surface Area: 1.66 m Body Mass Index: 23.34 kg/m  Temp.:  97.70F(Temporal)  Pulse: 81 (Regular)  BP: 124/74 (Sitting, Left Arm, Standard)      Physical Exam Emma Hollingshead MD; 01/29/2016 12:16 PM)  The physical exam findings are as follows: Note:General: WDWN in NAD. Pleasant and cooperative.  HEENT: [place]/AT, no external nasal or ear masses, mucous membranes are moist  EYES: EOMI, no scleral icterus,  NECK: Supple, lower transverse scar  CV: RRR, no murmur, no edema  CHEST: Breath sounds equal and clear. Respirations nonlabored.  BREASTS: Symmetrical in size. No dominant masses, nipple discharge or suspicious skin lesionson the right. Area of ecchymosis lateral left breast wth fullness deep to it and a small puncture wound present.  ABDOMEN: Soft, no hepatomegaly  LYMPHATIC: No palpable cervical, supraclavicular, axillary adenopathy.  SKIN: No jaundice or suspicious rashes.  NEUROLOGIC: Alert and oriented, answers questions appropriately, normal gait and station.  PSYCHIATRIC: Normal mood, affect , and behavior.    Assessment & Plan Emma Hollingshead MD; 01/29/2016 12:12 PM)  INTRADUCTAL PAPILLOMA OF BREAST, LEFT (D24.2) Impression: This has sclerosing features. Some of these have been reported to be upgraded to malignancy when completely excised. This has been discussed with her.  Plan: Left breast lumpectomy after radioactive seed localization. I have explained the procedure, risks, and aftercare to her. Risks include but are not limited to bleeding, infection, wound problems, seroma formation, cosmetic  deformity, anesthesia. She seems to [facility] and agrees with the plan.  Emma Confer, MD

## 2016-02-04 ENCOUNTER — Other Ambulatory Visit: Payer: Self-pay | Admitting: *Deleted

## 2016-02-04 ENCOUNTER — Other Ambulatory Visit: Payer: Self-pay | Admitting: General Surgery

## 2016-02-04 DIAGNOSIS — D242 Benign neoplasm of left breast: Secondary | ICD-10-CM

## 2016-02-12 ENCOUNTER — Encounter (HOSPITAL_BASED_OUTPATIENT_CLINIC_OR_DEPARTMENT_OTHER)
Admission: RE | Admit: 2016-02-12 | Discharge: 2016-02-12 | Disposition: A | Discharge status: Home / Self Care | Payer: PPO | Source: Ambulatory Visit | Attending: General Surgery | Admitting: General Surgery

## 2016-02-12 ENCOUNTER — Encounter (HOSPITAL_BASED_OUTPATIENT_CLINIC_OR_DEPARTMENT_OTHER): Payer: Self-pay | Admitting: *Deleted

## 2016-02-12 DIAGNOSIS — E039 Hypothyroidism, unspecified: Secondary | ICD-10-CM | POA: Diagnosis not present

## 2016-02-12 DIAGNOSIS — D242 Benign neoplasm of left breast: Secondary | ICD-10-CM | POA: Diagnosis not present

## 2016-02-12 DIAGNOSIS — F329 Major depressive disorder, single episode, unspecified: Secondary | ICD-10-CM | POA: Diagnosis not present

## 2016-02-12 LAB — COMPREHENSIVE METABOLIC PANEL
ALK PHOS: 49 U/L (ref 38–126)
ALT: 18 U/L (ref 14–54)
ANION GAP: 5 (ref 5–15)
AST: 22 U/L (ref 15–41)
Albumin: 3.6 g/dL (ref 3.5–5.0)
BILIRUBIN TOTAL: 0.5 mg/dL (ref 0.3–1.2)
BUN: 14 mg/dL (ref 6–20)
CALCIUM: 9.1 mg/dL (ref 8.9–10.3)
CO2: 27 mmol/L (ref 22–32)
Chloride: 105 mmol/L (ref 101–111)
Creatinine, Ser: 1.06 mg/dL — ABNORMAL HIGH (ref 0.44–1.00)
GFR, EST AFRICAN AMERICAN: 58 mL/min — AB (ref >60)
GFR, EST NON AFRICAN AMERICAN: 50 mL/min — AB (ref >60)
Glucose, Bld: 73 mg/dL (ref 65–99)
Potassium: 4.4 mmol/L (ref 3.5–5.1)
SODIUM: 137 mmol/L (ref 135–145)
TOTAL PROTEIN: 6.6 g/dL (ref 6.5–8.1)

## 2016-02-12 LAB — CBC WITH DIFFERENTIAL/PLATELET
Basophils Absolute: 0 10*3/uL (ref 0.0–0.1)
Basophils Relative: 1 %
EOS ABS: 0.1 10*3/uL (ref 0.0–0.7)
Eosinophils Relative: 2 %
HEMATOCRIT: 40 % (ref 36.0–46.0)
HEMOGLOBIN: 12.5 g/dL (ref 12.0–15.0)
LYMPHS ABS: 1.5 10*3/uL (ref 0.7–4.0)
Lymphocytes Relative: 34 %
MCH: 27.9 pg (ref 26.0–34.0)
MCHC: 31.3 g/dL (ref 30.0–36.0)
MCV: 89.3 fL (ref 78.0–100.0)
MONOS PCT: 12 %
Monocytes Absolute: 0.5 10*3/uL (ref 0.1–1.0)
NEUTROS PCT: 51 %
Neutro Abs: 2.2 10*3/uL (ref 1.7–7.7)
Platelets: 266 10*3/uL (ref 150–400)
RBC: 4.48 MIL/uL (ref 3.87–5.11)
RDW: 14.4 % (ref 11.5–15.5)
WBC: 4.3 10*3/uL (ref 4.0–10.5)

## 2016-02-12 NOTE — Pre-Procedure Instructions (Signed)
Patient given boost breeze with instruction to drink by Rush, verbalized understanding.

## 2016-02-18 DIAGNOSIS — E559 Vitamin D deficiency, unspecified: Secondary | ICD-10-CM | POA: Diagnosis not present

## 2016-02-18 DIAGNOSIS — E7800 Pure hypercholesterolemia, unspecified: Secondary | ICD-10-CM | POA: Diagnosis not present

## 2016-02-18 DIAGNOSIS — F329 Major depressive disorder, single episode, unspecified: Secondary | ICD-10-CM | POA: Diagnosis not present

## 2016-02-18 DIAGNOSIS — N183 Chronic kidney disease, stage 3 (moderate): Secondary | ICD-10-CM | POA: Diagnosis not present

## 2016-02-18 DIAGNOSIS — G4700 Insomnia, unspecified: Secondary | ICD-10-CM | POA: Diagnosis not present

## 2016-02-18 DIAGNOSIS — E039 Hypothyroidism, unspecified: Secondary | ICD-10-CM | POA: Diagnosis not present

## 2016-02-19 ENCOUNTER — Ambulatory Visit
Admission: RE | Admit: 2016-02-19 | Discharge: 2016-02-19 | Disposition: A | Discharge status: Home / Self Care | Payer: PPO | Source: Ambulatory Visit | Attending: General Surgery | Admitting: General Surgery

## 2016-02-19 DIAGNOSIS — R928 Other abnormal and inconclusive findings on diagnostic imaging of breast: Secondary | ICD-10-CM | POA: Diagnosis not present

## 2016-02-19 DIAGNOSIS — D242 Benign neoplasm of left breast: Secondary | ICD-10-CM

## 2016-02-20 ENCOUNTER — Ambulatory Visit (HOSPITAL_BASED_OUTPATIENT_CLINIC_OR_DEPARTMENT_OTHER): Payer: PPO | Admitting: Anesthesiology

## 2016-02-20 ENCOUNTER — Ambulatory Visit (HOSPITAL_BASED_OUTPATIENT_CLINIC_OR_DEPARTMENT_OTHER)
Admission: RE | Admit: 2016-02-20 | Discharge: 2016-02-20 | Disposition: A | Discharge status: Home / Self Care | Payer: PPO | Source: Ambulatory Visit | Attending: General Surgery | Admitting: General Surgery

## 2016-02-20 ENCOUNTER — Ambulatory Visit
Admission: RE | Admit: 2016-02-20 | Discharge: 2016-02-20 | Disposition: A | Discharge status: Home / Self Care | Payer: PPO | Source: Ambulatory Visit | Attending: General Surgery | Admitting: General Surgery

## 2016-02-20 ENCOUNTER — Encounter (HOSPITAL_BASED_OUTPATIENT_CLINIC_OR_DEPARTMENT_OTHER): Payer: Self-pay | Admitting: Anesthesiology

## 2016-02-20 ENCOUNTER — Encounter (HOSPITAL_BASED_OUTPATIENT_CLINIC_OR_DEPARTMENT_OTHER)
Admission: RE | Disposition: A | Discharge status: Home / Self Care | Payer: Self-pay | Source: Ambulatory Visit | Attending: General Surgery

## 2016-02-20 DIAGNOSIS — R921 Mammographic calcification found on diagnostic imaging of breast: Secondary | ICD-10-CM | POA: Diagnosis not present

## 2016-02-20 DIAGNOSIS — N6082 Other benign mammary dysplasias of left breast: Secondary | ICD-10-CM | POA: Diagnosis not present

## 2016-02-20 DIAGNOSIS — D242 Benign neoplasm of left breast: Secondary | ICD-10-CM

## 2016-02-20 DIAGNOSIS — N6012 Diffuse cystic mastopathy of left breast: Secondary | ICD-10-CM | POA: Diagnosis not present

## 2016-02-20 DIAGNOSIS — N6489 Other specified disorders of breast: Secondary | ICD-10-CM | POA: Diagnosis not present

## 2016-02-20 DIAGNOSIS — F329 Major depressive disorder, single episode, unspecified: Secondary | ICD-10-CM | POA: Insufficient documentation

## 2016-02-20 DIAGNOSIS — E039 Hypothyroidism, unspecified: Secondary | ICD-10-CM | POA: Insufficient documentation

## 2016-02-20 HISTORY — DX: Headache, unspecified: R51.9

## 2016-02-20 HISTORY — DX: Benign neoplasm of unspecified breast: D24.9

## 2016-02-20 HISTORY — PX: BREAST LUMPECTOMY WITH RADIOACTIVE SEED LOCALIZATION: SHX6424

## 2016-02-20 HISTORY — DX: Headache: R51

## 2016-02-20 SURGERY — BREAST LUMPECTOMY WITH RADIOACTIVE SEED LOCALIZATION
Anesthesia: General | Site: Breast | Laterality: Left

## 2016-02-20 MED ORDER — ACETAMINOPHEN 325 MG PO TABS: 650.0000 mg | ORAL_TABLET | ORAL | Status: DC | PRN

## 2016-02-20 MED ORDER — HYDROCODONE-ACETAMINOPHEN 5-325 MG PO TABS: 1.0000 | ORAL_TABLET | ORAL | 0 refills | Status: AC | PRN

## 2016-02-20 MED ORDER — FENTANYL CITRATE (PF) 100 MCG/2ML IJ SOLN
INTRAMUSCULAR | Status: AC
  Filled 2016-02-20: qty 2

## 2016-02-20 MED ORDER — CEFAZOLIN SODIUM-DEXTROSE 2-4 GM/100ML-% IV SOLN
INTRAVENOUS | Status: AC
  Filled 2016-02-20: qty 100

## 2016-02-20 MED ORDER — ONDANSETRON HCL 4 MG/2ML IJ SOLN
INTRAMUSCULAR | Status: DC | PRN
  Administered 2016-02-20: 4 mg via INTRAVENOUS

## 2016-02-20 MED ORDER — EPHEDRINE 5 MG/ML INJ
INTRAVENOUS | Status: AC
  Filled 2016-02-20: qty 10

## 2016-02-20 MED ORDER — ONDANSETRON HCL 4 MG/2ML IJ SOLN: 4.0000 mg | Freq: Once | INTRAMUSCULAR | Status: DC | PRN

## 2016-02-20 MED ORDER — CHLORHEXIDINE GLUCONATE CLOTH 2 % EX PADS: 6.0000 | MEDICATED_PAD | Freq: Once | CUTANEOUS | Status: DC

## 2016-02-20 MED ORDER — ONDANSETRON HCL 4 MG/2ML IJ SOLN
INTRAMUSCULAR | Status: AC
  Filled 2016-02-20: qty 2

## 2016-02-20 MED ORDER — PHENYLEPHRINE 40 MCG/ML (10ML) SYRINGE FOR IV PUSH (FOR BLOOD PRESSURE SUPPORT)
PREFILLED_SYRINGE | INTRAVENOUS | Status: AC
  Filled 2016-02-20: qty 10

## 2016-02-20 MED ORDER — LACTATED RINGERS IV SOLN
INTRAVENOUS | Status: DC
  Administered 2016-02-20: 09:00:00 via INTRAVENOUS

## 2016-02-20 MED ORDER — ACETAMINOPHEN 650 MG RE SUPP: 650.0000 mg | RECTAL | Status: DC | PRN

## 2016-02-20 MED ORDER — FENTANYL CITRATE (PF) 100 MCG/2ML IJ SOLN
25.0000 ug | INTRAMUSCULAR | Status: DC | PRN
  Administered 2016-02-20 (×3): 50 ug via INTRAVENOUS

## 2016-02-20 MED ORDER — GABAPENTIN 300 MG PO CAPS
ORAL_CAPSULE | ORAL | Status: AC
  Filled 2016-02-20: qty 1

## 2016-02-20 MED ORDER — MORPHINE SULFATE (PF) 2 MG/ML IV SOLN: 2.0000 mg | INTRAVENOUS | Status: DC | PRN

## 2016-02-20 MED ORDER — BUPIVACAINE HCL (PF) 0.5 % IJ SOLN
INTRAMUSCULAR | Status: DC | PRN
  Administered 2016-02-20: 8 mL

## 2016-02-20 MED ORDER — GABAPENTIN 300 MG PO CAPS
300.0000 mg | ORAL_CAPSULE | ORAL | Status: AC
  Administered 2016-02-20: 300 mg via ORAL

## 2016-02-20 MED ORDER — ACETAMINOPHEN 500 MG PO TABS
ORAL_TABLET | ORAL | Status: AC
Start: 2016-02-20 — End: 2016-02-20
  Filled 2016-02-20: qty 2

## 2016-02-20 MED ORDER — CEFAZOLIN SODIUM-DEXTROSE 2-4 GM/100ML-% IV SOLN
2.0000 g | INTRAVENOUS | Status: AC
Start: 2016-02-20 — End: 2016-02-20
  Administered 2016-02-20: 2 g via INTRAVENOUS

## 2016-02-20 MED ORDER — FENTANYL CITRATE (PF) 100 MCG/2ML IJ SOLN
50.0000 ug | INTRAMUSCULAR | Status: DC | PRN
  Administered 2016-02-20: 75 ug via INTRAVENOUS

## 2016-02-20 MED ORDER — LIDOCAINE 2% (20 MG/ML) 5 ML SYRINGE
INTRAMUSCULAR | Status: AC
  Filled 2016-02-20: qty 5

## 2016-02-20 MED ORDER — EPHEDRINE SULFATE 50 MG/ML IJ SOLN
INTRAMUSCULAR | Status: DC | PRN
  Administered 2016-02-20 (×2): 10 mg via INTRAVENOUS

## 2016-02-20 MED ORDER — LIDOCAINE 2% (20 MG/ML) 5 ML SYRINGE
INTRAMUSCULAR | Status: DC | PRN
  Administered 2016-02-20: 100 mg via INTRAVENOUS

## 2016-02-20 MED ORDER — MIDAZOLAM HCL 2 MG/2ML IJ SOLN: 1.0000 mg | INTRAMUSCULAR | Status: DC | PRN

## 2016-02-20 MED ORDER — SODIUM CHLORIDE 0.9% FLUSH: 3.0000 mL | INTRAVENOUS | Status: DC | PRN

## 2016-02-20 MED ORDER — DEXAMETHASONE SODIUM PHOSPHATE 4 MG/ML IJ SOLN
INTRAMUSCULAR | Status: DC | PRN
  Administered 2016-02-20: 5 mg via INTRAVENOUS

## 2016-02-20 MED ORDER — OXYCODONE HCL 5 MG PO TABS: 5.0000 mg | ORAL_TABLET | ORAL | Status: DC | PRN

## 2016-02-20 MED ORDER — GLYCOPYRROLATE 0.2 MG/ML IJ SOLN: 0.2000 mg | Freq: Once | INTRAMUSCULAR | Status: DC | PRN

## 2016-02-20 MED ORDER — DEXAMETHASONE SODIUM PHOSPHATE 10 MG/ML IJ SOLN
INTRAMUSCULAR | Status: AC
  Filled 2016-02-20: qty 1

## 2016-02-20 MED ORDER — PROPOFOL 10 MG/ML IV BOLUS
INTRAVENOUS | Status: DC | PRN
  Administered 2016-02-20: 180 mg via INTRAVENOUS

## 2016-02-20 MED ORDER — ACETAMINOPHEN 500 MG PO TABS
1000.0000 mg | ORAL_TABLET | ORAL | Status: AC
  Administered 2016-02-20: 1000 mg via ORAL

## 2016-02-20 MED ORDER — SCOPOLAMINE 1 MG/3DAYS TD PT72: 1.0000 | MEDICATED_PATCH | Freq: Once | TRANSDERMAL | Status: DC | PRN

## 2016-02-20 SURGICAL SUPPLY — 50 items
APPLIER CLIP 9.375 MED OPEN (MISCELLANEOUS)
BENZOIN TINCTURE PRP APPL 2/3 (GAUZE/BANDAGES/DRESSINGS) IMPLANT
BINDER BREAST LRG (GAUZE/BANDAGES/DRESSINGS) IMPLANT
BINDER BREAST MEDIUM (GAUZE/BANDAGES/DRESSINGS) IMPLANT
BINDER BREAST XLRG (GAUZE/BANDAGES/DRESSINGS) IMPLANT
BINDER BREAST XXLRG (GAUZE/BANDAGES/DRESSINGS) IMPLANT
BLADE SURG 15 STRL LF DISP TIS (BLADE) ×1 IMPLANT
BLADE SURG 15 STRL SS (BLADE) ×2
CANISTER SUC SOCK COL 7IN (MISCELLANEOUS) IMPLANT
CANISTER SUCT 1200ML W/VALVE (MISCELLANEOUS) IMPLANT
CHLORAPREP W/TINT 26ML (MISCELLANEOUS) ×3 IMPLANT
CLIP APPLIE 9.375 MED OPEN (MISCELLANEOUS) IMPLANT
CLOSURE WOUND 1/2 X4 (GAUZE/BANDAGES/DRESSINGS)
COVER BACK TABLE 60X90IN (DRAPES) ×3 IMPLANT
COVER MAYO STAND STRL (DRAPES) ×3 IMPLANT
COVER PROBE W GEL 5X96 (DRAPES) ×3 IMPLANT
DECANTER SPIKE VIAL GLASS SM (MISCELLANEOUS) IMPLANT
DERMABOND ADVANCED (GAUZE/BANDAGES/DRESSINGS)
DERMABOND ADVANCED .7 DNX12 (GAUZE/BANDAGES/DRESSINGS) IMPLANT
DEVICE DUBIN W/COMP PLATE 8390 (MISCELLANEOUS) IMPLANT
DRAPE LAPAROTOMY 100X72 PEDS (DRAPES) ×3 IMPLANT
DRAPE UTILITY XL STRL (DRAPES) ×3 IMPLANT
DRSG TELFA 3X8 NADH (GAUZE/BANDAGES/DRESSINGS) ×3 IMPLANT
ELECT COATED BLADE 2.86 ST (ELECTRODE) ×3 IMPLANT
ELECT REM PT RETURN 9FT ADLT (ELECTROSURGICAL) ×3
ELECTRODE REM PT RTRN 9FT ADLT (ELECTROSURGICAL) ×1 IMPLANT
GAUZE SPONGE 4X4 12PLY STRL (GAUZE/BANDAGES/DRESSINGS) ×3 IMPLANT
GLOVE BIOGEL PI IND STRL 8.5 (GLOVE) ×1 IMPLANT
GLOVE BIOGEL PI INDICATOR 8.5 (GLOVE) ×2
GLOVE ECLIPSE 8.0 STRL XLNG CF (GLOVE) ×3 IMPLANT
GOWN STRL REUS W/ TWL LRG LVL3 (GOWN DISPOSABLE) ×2 IMPLANT
GOWN STRL REUS W/TWL LRG LVL3 (GOWN DISPOSABLE) ×4
KIT MARKER MARGIN INK (KITS) ×3 IMPLANT
NEEDLE HYPO 25X1 1.5 SAFETY (NEEDLE) ×3 IMPLANT
NS IRRIG 1000ML POUR BTL (IV SOLUTION) ×3 IMPLANT
PACK BASIN DAY SURGERY FS (CUSTOM PROCEDURE TRAY) ×3 IMPLANT
PENCIL BUTTON HOLSTER BLD 10FT (ELECTRODE) ×3 IMPLANT
SLEEVE SCD COMPRESS KNEE MED (MISCELLANEOUS) ×3 IMPLANT
SPONGE GAUZE 4X4 12PLY STER LF (GAUZE/BANDAGES/DRESSINGS) ×3 IMPLANT
SPONGE LAP 4X18 X RAY DECT (DISPOSABLE) ×3 IMPLANT
STRIP CLOSURE SKIN 1/2X4 (GAUZE/BANDAGES/DRESSINGS) IMPLANT
SUT MON AB 4-0 PC3 18 (SUTURE) ×3 IMPLANT
SUT SILK 2 0 FS (SUTURE) ×3 IMPLANT
SUT VICRYL 3-0 CR8 SH (SUTURE) ×3 IMPLANT
SYR CONTROL 10ML LL (SYRINGE) ×3 IMPLANT
TOWEL OR 17X24 6PK STRL BLUE (TOWEL DISPOSABLE) ×3 IMPLANT
TOWEL OR NON WOVEN STRL DISP B (DISPOSABLE) ×3 IMPLANT
TUBE CONNECTING 20'X1/4 (TUBING)
TUBE CONNECTING 20X1/4 (TUBING) IMPLANT
YANKAUER SUCT BULB TIP NO VENT (SUCTIONS) IMPLANT

## 2016-02-20 NOTE — Anesthesia Procedure Notes (Signed)
Procedure Name: LMA Insertion Date/Time: 02/20/2016 9:13 AM Performed by: Lieutenant Diego Pre-anesthesia Checklist: Patient identified, Emergency Drugs available, Suction available and Patient being monitored Patient Re-evaluated:Patient Re-evaluated prior to inductionOxygen Delivery Method: Circle system utilized Preoxygenation: Pre-oxygenation with 100% oxygen Intubation Type: IV induction Ventilation: Mask ventilation without difficulty LMA: LMA inserted LMA Size: 4.0 Tube size: 4.0 mm Number of attempts: 1 Airway Equipment and Method: Bite block Placement Confirmation: positive ETCO2 and breath sounds checked- equal and bilateral Tube secured with: Tape Dental Injury: Teeth and Oropharynx as per pre-operative assessment

## 2016-02-20 NOTE — Op Note (Signed)
Operative Note  [name] female 75 y.o. 02/20/2016  PREOPERATIVE DX:  Left breast intraductal papilloma with microcalcifications  POSTOPERATIVE DX:  Same  PROCEDURE:   Left breast lumpectomy after radioactive seed localization         Surgeon: Arista Kettlewell J   Assistants: None  Anesthesia: General mask inhalational anesthesia + Marcaine local  Indications:   This is a 75 year old female who said an area of expanding microcalcifications posterior to the nipple areolar complex.  Biopsy demonstrated intraductal papilloma which is felt to be  Somewhat discordant as some the calcifications look suspicious. She now presents for the above procedure.  She underwent successful radioactive seed implantation yesterday by Dr. Nolon Nations.    Procedure Detail:  She was seen in the holding area in the left breast marked with my initials.  Radioactive seed was verified in the left breast using the neoprobe. She is brought to the operating room placed supine on the operating table and general anesthetic was given. The left breast was sterilely prepped and draped. A timeout was performed.  Using the neoprobe the area of increased counts in the inferior nipple areolar area was identified.  Local anesthetic was infiltrated from the 3:00 to the 9:00 position in the circumareolar area. A circumareolar incision was then made and that same area dividing the skin and subcutaneous tissue. Subcutaneous flaps were raised in all directions. Using the appropriate area of increased counts was identified and a suture was placed to mark the anterior margin. I then did a lumpectomy around the area of increased counts keeping it in the middle and extending the dissection for approximately 1-1/2-2 cm in all directions which was consistent with what I saw on the mammogram with respect to the microcalcifications. Deep margin was down to the chest wall. Once the specimen was removed, ink was used to orient the  margins.  Specimen mammogram was performed.  The clip and radioactive seed were in the middle of the specimen and microcalcifications were visible in the specimen. This was verified by the radiologist.  Lumpectomy bed had no radioactivity. The specimen was sent to pathology where the seed was retrieved.  Bleeding was controlled electrocautery. Local anesthetic was then infiltrated into the subcutaneous tissues. The subcutaneous tissues and approximated  With interrupted 3-0 Vicryl sutures. The skin was closed with a running 4-0 Monocryl subcuticular stitch. Steri-Strips, sterile dressing, and breast binder were applied.  She tolerated the procedure without any apparent complications. She was taken to the recovery room in satisfactory condition. Estimated blood loss less than 100 cc.         Specimens: Left breast tissue        Complications:  * No complications entered in OR log *         Disposition: PACU - hemodynamically stable.         Condition: stable

## 2016-02-20 NOTE — Discharge Instructions (Addendum)
[place] Office Phone Number [phone]  BREAST BIOPSY/ PARTIAL MASTECTOMY: POST OP INSTRUCTIONS  Always review your discharge instruction sheet given to you by the facility where your surgery was performed.  IF YOU HAVE DISABILITY OR FAMILY LEAVE FORMS, YOU MUST BRING THEM TO THE OFFICE FOR PROCESSING.  DO NOT GIVE THEM TO YOUR DOCTOR.  1. A prescription for pain medication may be given to you upon discharge.  Take your pain medication as prescribed, if needed.  If narcotic pain medicine is not needed, then you may take acetaminophen (Tylenol) or ibuprofen (Advil) as needed. 2. Take your usually prescribed medications unless otherwise directed 3. If you need a refill on your pain medication, please contact your pharmacy.  They will contact our office to request authorization.  Prescriptions will not be filled after 5pm or on week-ends.   Post Anesthesia Home Care Instructions  Activity: Get plenty of rest for the remainder of the day. A responsible adult should stay with you for 24 hours following the procedure.  For the next 24 hours, DO NOT: -Drive a car -Paediatric nurse -Drink alcoholic beverages -Take any medication unless instructed by your physician -Make any legal decisions or sign important papers.  Meals: Start with liquid foods such as gelatin or soup. Progress to regular foods as tolerated. Avoid greasy, spicy, heavy foods. If nausea and/or vomiting occur, drink only clear liquids until the nausea and/or vomiting subsides. Call your physician if vomiting continues.  Special Instructions/Symptoms: Your throat may feel dry or sore from the anesthesia or the breathing tube placed in your throat during surgery. If this causes discomfort, gargle with warm salt water. The discomfort should disappear within 24 hours.  If you had a scopolamine patch placed behind your ear for the management of post- operative nausea and/or vomiting:  1. The medication in  the patch is effective for 72 hours, after which it should be removed.  Wrap patch in a tissue and discard in the trash. Wash hands thoroughly with soap and water. 2. You may remove the patch earlier than 72 hours if you experience unpleasant side effects which may include dry mouth, dizziness or visual disturbances. 3. Avoid touching the patch. Wash your hands with soap and water after contact with the patch.      Post Anesthesia Home Care Instructions  Activity: Get plenty of rest for the remainder of the day. A responsible adult should stay with you for 24 hours following the procedure.  For the next 24 hours, DO NOT: -Drive a car -Paediatric nurse -Drink alcoholic beverages -Take any medication unless instructed by your physician -Make any legal decisions or sign important papers.  Meals: Start with liquid foods such as gelatin or soup. Progress to regular foods as tolerated. Avoid greasy, spicy, heavy foods. If nausea and/or vomiting occur, drink only clear liquids until the nausea and/or vomiting subsides. Call your physician if vomiting continues.  Special Instructions/Symptoms: Your throat may feel dry or sore from the anesthesia or the breathing tube placed in your throat during surgery. If this causes discomfort, gargle with warm salt water. The discomfort should disappear within 24 hours.  If you had a scopolamine patch placed behind your ear for the management of post- operative nausea and/or vomiting:  1. The medication in the patch is effective for 72 hours, after which it should be removed.  Wrap patch in a tissue and discard in the trash. Wash hands thoroughly with soap and water. 2. You may remove the patch  earlier than 72 hours if you experience unpleasant side effects which may include dry mouth, dizziness or visual disturbances. 3. Avoid touching the patch. Wash your hands with soap and water after contact with the patch.    4. You should eat very light the first  24 hours after surgery, such as soup, crackers, pudding, etc.  Resume your normal diet the day after surgery. 5. Most patients will experience some swelling and bruising in the breast.  Ice packs and a good support bra will help.  Swelling and bruising can take several days to resolve.  6. It is common to experience some constipation if taking pain medication after surgery.  Increasing fluid intake and taking a stool softener will usually help or prevent this problem from occurring.  A mild laxative (Milk of Magnesia or Miralax) should be taken according to package directions if there are no bowel movements after 48 hours. 7. Unless discharge instructions indicate otherwise, you may remove your bandages 48 hours after surgery, and you may shower at that time.  You may have steri-strips (small skin tapes) in place directly over the incision.  These strips should be left on the skin.  If your surgeon used skin glue on the incision, you may shower in 24 hours.  The glue will flake off over the next 2-3 weeks.  Any sutures or staples will be removed at the office during your follow-up visit. 8. ACTIVITIES:  You may resume regular daily activities (gradually increasing) beginning the next day.  Wearing a good support bra or sports bra minimizes pain and swelling.  You may have sexual intercourse when it is comfortable. a. You may drive when you no longer are taking prescription pain medication, you can comfortably wear a seatbelt, and you can safely maneuver your car and apply brakes. b. RETURN TO WORK:  ______________________________________________________________________________________ 9. You should see your doctor in the office for a follow-up appointment as scheduled.  Expect your pathology report 3 business days after your surgery.  You may call to check if you do not hear from [place] after three days. 10. OTHER INSTRUCTIONS:  _______________________________________________________________________________________________ _____________________________________________________________________________________________________________________________________ _____________________________________________________________________________________________________________________________________ _____________________________________________________________________________________________________________________________________  WHEN TO CALL YOUR DOCTOR: 1. Fever over 101.0 2. Nausea and/or vomiting. 3. Extreme swelling or bruising. 4. Continued bleeding from incision. 5. Increased pain, redness, or drainage from the incision.  The clinic staff is available to answer your questions during regular business hours.  Please dont hesitate to call and ask to speak to one of the nurses for clinical concerns.  If you have a medical emergency, go to the nearest emergency room or call 911.  A surgeon from [facility] is always on call at the hospital.  For further questions, please visit [email or web address]

## 2016-02-20 NOTE — Anesthesia Postprocedure Evaluation (Signed)
Anesthesia Post Note  Patient: [name]  Procedure(s) Performed: Procedure(s) (LRB): LEFT BREAST LUMPECTOMY WITH RADIOACTIVE SEED LOCALIZATION (Left)  Patient location during evaluation: PACU Anesthesia Type: General Level of consciousness: awake and alert Pain management: pain level controlled Vital Signs Assessment: post-procedure vital signs reviewed and stable Respiratory status: spontaneous breathing, nonlabored ventilation and respiratory function stable Cardiovascular status: blood pressure returned to baseline and stable Postop Assessment: no signs of nausea or vomiting Anesthetic complications: no    Last Vitals:  Vitals:   02/20/16 1145 02/20/16 1235  BP: 136/68 119/65  Pulse: 73 76  Resp: (!) 0 16  Temp:  36.4 C    Last Pain:  Vitals:   02/20/16 1235  TempSrc: Oral  PainSc: 4                  Nilda Simmer

## 2016-02-20 NOTE — Interval H&P Note (Signed)
History and Physical Interval Note:  02/20/2016 8:42 AM  [name]  has presented today for surgery, with the diagnosis of SCLEROSING PAPILLOMA OF LEFT BREAST  The various methods of treatment have been discussed with the patient and family. After consideration of risks, benefits and other options for treatment, the patient has consented to  Procedure(s): LEFT BREAST LUMPECTOMY WITH RADIOACTIVE SEED LOCALIZATION (Left) as a surgical intervention .  The patient's history has been reviewed, patient examined, no change in status, stable for surgery.  I have reviewed the patient's chart and labs.  Questions were answered to the patient's satisfaction.     Eleri Ruben Lenna Sciara

## 2016-02-20 NOTE — Transfer of Care (Signed)
Immediate Anesthesia Transfer of Care Note  Patient: [name]  Procedure(s) Performed: Procedure(s) with comments: LEFT BREAST LUMPECTOMY WITH RADIOACTIVE SEED LOCALIZATION (Left) - LEFT BREAST LUMPECTOMY WITH RADIOACTIVE SEED LOCALIZATION  Patient Location: PACU  Anesthesia Type:General  Level of Consciousness: awake  Airway & Oxygen Therapy: Patient Spontanous Breathing and Patient connected to face mask oxygen  Post-op Assessment: Report given to RN and Post -op Vital signs reviewed and stable  Post vital signs: Reviewed and stable  Last Vitals:  Vitals:   02/20/16 0757  BP: 121/62  Pulse: 72  Resp: 20  Temp: 36.5 C    Last Pain:  Vitals:   02/20/16 0757  TempSrc: Oral         Complications: No apparent anesthesia complications

## 2016-02-20 NOTE — Anesthesia Preprocedure Evaluation (Signed)
Anesthesia Evaluation  Patient identified by MRN, date of birth, ID band Patient awake    Reviewed: Allergy & Precautions, NPO status , Patient's Chart, lab work & pertinent test results  History of Anesthesia Complications Negative for: history of anesthetic complications  Airway Mallampati: II  TM Distance: >3 FB Neck ROM: Full    Dental  (+) Teeth Intact, Dental Advisory Given, Implants   Pulmonary neg pulmonary ROS,    Pulmonary exam normal breath sounds clear to auscultation       Cardiovascular Exercise Tolerance: Good negative cardio ROS Normal cardiovascular exam Rhythm:Regular Rate:Normal     Neuro/Psych  Headaches, PSYCHIATRIC DISORDERS Depression    GI/Hepatic negative GI ROS, Neg liver ROS,   Endo/Other  Hypothyroidism   Renal/GU negative Renal ROS     Musculoskeletal negative musculoskeletal ROS (+)   Abdominal   Peds  Hematology negative hematology ROS (+)   Anesthesia Other Findings Day of surgery medications reviewed with the patient.  Reproductive/Obstetrics                             Anesthesia Physical Anesthesia Plan  ASA: II  Anesthesia Plan: General   Post-op Pain Management:    Induction: Intravenous  Airway Management Planned: LMA  Additional Equipment:   Intra-op Plan:   Post-operative Plan: Extubation in OR  Informed Consent: I have reviewed the patients History and Physical, chart, labs and discussed the procedure including the risks, benefits and alternatives for the proposed anesthesia with the patient or authorized representative who has indicated his/her understanding and acceptance.   Dental advisory given  Plan Discussed with: CRNA  Anesthesia Plan Comments: (Risks/benefits of general anesthesia discussed with patient including risk of damage to teeth, lips, gum, and tongue, nausea/vomiting, allergic reactions to medications, and the  possibility of heart attack, stroke and death.  All patient questions answered.  Patient wishes to proceed.)        Anesthesia Quick Evaluation

## 2016-02-20 NOTE — H&P (View-Only) (Signed)
[name] 01/29/2016 11:33 AM Location: [facility] Patient #: [id number] DOB: [birth year] Single / Language: [name] / Race: White Female  History of Present Illness Emma Hollingshead MD; 01/29/2016 12:15 PM) The patient is a 75 year old female.   Note:She is referred by Dr. Ammie Ferrier for consultation recurrent guarding a sclerosing intraductal papilloma of left breast. She had calcifications biopsied and left breast in 2012 which demonstrated fibrocystic change. Mammogram this year demonstrated increasing calcifications spanning 3 cm. Biopsy demonstrated the above pathology as well as usual ductal hyperplasia. Wider excision was recommended and she presents to discuss that. She states she feels a lump at the biopsy site. She feels one of her aunts had breast cancer but lived to be into her 45s.  Other Problems Marjean Donna, CMA; 01/29/2016 11:33 AM) Cholelithiasis Depression Diverticulosis Hemorrhoids Kidney Stone Oophorectomy Right. Thyroid Cancer Thyroid Disease  Past Surgical History Marjean Donna, CMA; 01/29/2016 11:33 AM) Cataract Surgery Bilateral. Gallbladder Surgery - Laparoscopic Hemorrhoidectomy Hip Surgery Left. Hysterectomy (not due to cancer) - Partial Oral Surgery Thyroid Surgery Tonsillectomy  Diagnostic Studies History Marjean Donna, CMA; 01/29/2016 11:33 AM) Colonoscopy within last year Mammogram within last year Pap Smear 1-5 years ago  Allergies Marjean Donna, CMA; 01/29/2016 11:34 AM) Sulfa Antibiotics  Medication History (Sonya Bynum, CMA; 01/29/2016 11:34 AM) Citalopram Hydrobromide (20MG  Tablet, Oral) Active. Levothyroxine Sodium (100MCG Tablet, Oral) Active. BuPROPion HCl ER (XL) (300MG  Tablet ER 24HR, Oral) Active. Vitamin D (Ergocalciferol) (50000UNIT Capsule, Oral) Active. ValACYclovir HCl (500MG  Tablet, Oral) Active. TraZODone HCl (50MG  Tablet, Oral) Active. Medications Reconciled  Social  History Marjean Donna, CMA; 01/29/2016 11:33 AM) Alcohol use Occasional alcohol use. Caffeine use Coffee, Tea. No drug use Tobacco use Never smoker.  Family History Marjean Donna, [place]; 01/29/2016 11:33 AM) Arthritis Mother. Cancer Brother. Diabetes Mellitus Father. Heart Disease Father. Prostate Cancer Brother.  Pregnancy / Birth History Marjean Donna, [place]; 01/29/2016 11:33 AM) Age at menarche 59 years. Gravida 2 Maternal age 46-25 Para 2     Review of Systems ([place]; 01/29/2016 11:33 AM) General Not Present- Appetite Loss, Chills, Fatigue, Fever, Night Sweats, Weight Gain and Weight Loss. Skin Not Present- Change in Wart/Mole, Dryness, Hives, Jaundice, New Lesions, Non-Healing Wounds, Rash and Ulcer. HEENT Present- Wears glasses/contact lenses. Not Present- Earache, Hearing Loss, Hoarseness, Nose Bleed, Oral Ulcers, Ringing in the Ears, Seasonal Allergies, Sinus Pain, Sore Throat, Visual Disturbances and Yellow Eyes. Breast Not Present- Breast Mass, Breast Pain, Nipple Discharge and Skin Changes. Cardiovascular Not Present- Chest Pain, Difficulty Breathing Lying Down, Leg Cramps, Palpitations, Rapid Heart Rate, Shortness of Breath and Swelling of Extremities. Gastrointestinal Present- Constipation. Not Present- Abdominal Pain, Bloating, Bloody Stool, Change in Bowel Habits, Chronic diarrhea, Difficulty Swallowing, Excessive gas, Gets full quickly at meals, Hemorrhoids, Indigestion, Nausea, Rectal Pain and Vomiting. Female Genitourinary Not Present- Frequency, Nocturia, Painful Urination, Pelvic Pain and Urgency. Musculoskeletal Not Present- Back Pain, Joint Pain, Joint Stiffness, Muscle Pain, Muscle Weakness and Swelling of Extremities. Neurological Not Present- Decreased Memory, Fainting, Headaches, Numbness, Seizures, Tingling, Tremor, Trouble walking and Weakness. Psychiatric Not Present- Anxiety, Bipolar, Change in Sleep Pattern, Depression, Fearful and Frequent  crying. Endocrine Not Present- Cold Intolerance, Excessive Hunger, Hair Changes, Heat Intolerance, Hot flashes and New Diabetes. Hematology Not Present- Blood Thinners, Easy Bruising, Excessive bleeding, Gland problems, HIV and Persistent Infections.  Vitals (Sonya Bynum CMA; 01/29/2016 11:34 AM) 01/29/2016 11:33 AM Weight: 136 lb Height: 64in Body Surface Area: 1.66 m Body Mass Index: 23.34 kg/m  Temp.:  97.52F(Temporal)  Pulse: 81 (Regular)  BP: 124/74 (Sitting, Left Arm, Standard)      Physical Exam Emma Hollingshead MD; 01/29/2016 12:16 PM)  The physical exam findings are as follows: Note:General: WDWN in NAD. Pleasant and cooperative.  HEENT: /AT, no external nasal or ear masses, mucous membranes are moist  EYES: EOMI, no scleral icterus,  NECK: Supple, lower transverse scar  CV: RRR, no murmur, no edema  CHEST: Breath sounds equal and clear. Respirations nonlabored.  BREASTS: Symmetrical in size. No dominant masses, nipple discharge or suspicious skin lesionson the right. Area of ecchymosis lateral left breast wth fullness deep to it and a small puncture wound present.  ABDOMEN: Soft, no hepatomegaly  LYMPHATIC: No palpable cervical, supraclavicular, axillary adenopathy.  SKIN: No jaundice or suspicious rashes.  NEUROLOGIC: Alert and oriented, answers questions appropriately, normal gait and station.  PSYCHIATRIC: Normal mood, affect , and behavior.    Assessment & Plan Emma Hollingshead MD; 01/29/2016 12:12 PM)  INTRADUCTAL PAPILLOMA OF BREAST, LEFT (D24.2) Impression: This has sclerosing features. Some of these have been reported to be upgraded to malignancy when completely excised. This has been discussed with her.  Plan: Left breast lumpectomy after radioactive seed localization. I have explained the procedure, risks, and aftercare to her. Risks include but are not limited to bleeding, infection, wound problems, seroma formation, cosmetic  deformity, anesthesia. She seems to [facility] and agrees with the plan.  Jackolyn Confer, MD

## 2016-02-21 ENCOUNTER — Encounter (HOSPITAL_BASED_OUTPATIENT_CLINIC_OR_DEPARTMENT_OTHER): Payer: Self-pay | Admitting: General Surgery

## 2016-02-27 DIAGNOSIS — R238 Other skin changes: Secondary | ICD-10-CM | POA: Diagnosis not present

## 2016-02-27 DIAGNOSIS — T148 Other injury of unspecified body region: Secondary | ICD-10-CM | POA: Diagnosis not present

## 2016-02-27 DIAGNOSIS — R404 Transient alteration of awareness: Secondary | ICD-10-CM | POA: Diagnosis not present

## 2016-02-27 DIAGNOSIS — R55 Syncope and collapse: Secondary | ICD-10-CM | POA: Diagnosis not present

## 2016-03-09 DIAGNOSIS — M9903 Segmental and somatic dysfunction of lumbar region: Secondary | ICD-10-CM | POA: Diagnosis not present

## 2016-03-09 DIAGNOSIS — M545 Low back pain: Secondary | ICD-10-CM | POA: Diagnosis not present

## 2016-03-09 DIAGNOSIS — M9901 Segmental and somatic dysfunction of cervical region: Secondary | ICD-10-CM | POA: Diagnosis not present

## 2016-03-09 DIAGNOSIS — Z9012 Acquired absence of left breast and nipple: Secondary | ICD-10-CM | POA: Diagnosis not present

## 2016-03-09 DIAGNOSIS — M542 Cervicalgia: Secondary | ICD-10-CM | POA: Diagnosis not present

## 2016-03-12 DIAGNOSIS — M9901 Segmental and somatic dysfunction of cervical region: Secondary | ICD-10-CM | POA: Diagnosis not present

## 2016-03-12 DIAGNOSIS — M542 Cervicalgia: Secondary | ICD-10-CM | POA: Diagnosis not present

## 2016-03-12 DIAGNOSIS — M545 Low back pain: Secondary | ICD-10-CM | POA: Diagnosis not present

## 2016-03-12 DIAGNOSIS — M9903 Segmental and somatic dysfunction of lumbar region: Secondary | ICD-10-CM | POA: Diagnosis not present

## 2016-03-19 DIAGNOSIS — M542 Cervicalgia: Secondary | ICD-10-CM | POA: Diagnosis not present

## 2016-03-19 DIAGNOSIS — M9901 Segmental and somatic dysfunction of cervical region: Secondary | ICD-10-CM | POA: Diagnosis not present

## 2016-03-19 DIAGNOSIS — M545 Low back pain: Secondary | ICD-10-CM | POA: Diagnosis not present

## 2016-03-19 DIAGNOSIS — M9903 Segmental and somatic dysfunction of lumbar region: Secondary | ICD-10-CM | POA: Diagnosis not present

## 2016-03-30 DIAGNOSIS — M545 Low back pain: Secondary | ICD-10-CM | POA: Diagnosis not present

## 2016-03-30 DIAGNOSIS — M9901 Segmental and somatic dysfunction of cervical region: Secondary | ICD-10-CM | POA: Diagnosis not present

## 2016-03-30 DIAGNOSIS — M542 Cervicalgia: Secondary | ICD-10-CM | POA: Diagnosis not present

## 2016-03-30 DIAGNOSIS — M9903 Segmental and somatic dysfunction of lumbar region: Secondary | ICD-10-CM | POA: Diagnosis not present

## 2016-04-09 DIAGNOSIS — M9903 Segmental and somatic dysfunction of lumbar region: Secondary | ICD-10-CM | POA: Diagnosis not present

## 2016-04-09 DIAGNOSIS — M545 Low back pain: Secondary | ICD-10-CM | POA: Diagnosis not present

## 2016-04-09 DIAGNOSIS — M9901 Segmental and somatic dysfunction of cervical region: Secondary | ICD-10-CM | POA: Diagnosis not present

## 2016-04-09 DIAGNOSIS — M542 Cervicalgia: Secondary | ICD-10-CM | POA: Diagnosis not present

## 2016-04-21 DIAGNOSIS — M9901 Segmental and somatic dysfunction of cervical region: Secondary | ICD-10-CM | POA: Diagnosis not present

## 2016-04-21 DIAGNOSIS — M545 Low back pain: Secondary | ICD-10-CM | POA: Diagnosis not present

## 2016-04-21 DIAGNOSIS — M542 Cervicalgia: Secondary | ICD-10-CM | POA: Diagnosis not present

## 2016-04-21 DIAGNOSIS — M9903 Segmental and somatic dysfunction of lumbar region: Secondary | ICD-10-CM | POA: Diagnosis not present

## 2016-07-21 DIAGNOSIS — R05 Cough: Secondary | ICD-10-CM | POA: Diagnosis not present

## 2016-09-01 DIAGNOSIS — M542 Cervicalgia: Secondary | ICD-10-CM | POA: Diagnosis not present

## 2016-09-01 DIAGNOSIS — M9903 Segmental and somatic dysfunction of lumbar region: Secondary | ICD-10-CM | POA: Diagnosis not present

## 2016-09-01 DIAGNOSIS — M9901 Segmental and somatic dysfunction of cervical region: Secondary | ICD-10-CM | POA: Diagnosis not present

## 2016-09-01 DIAGNOSIS — M545 Low back pain: Secondary | ICD-10-CM | POA: Diagnosis not present

## 2016-09-21 DIAGNOSIS — H04123 Dry eye syndrome of bilateral lacrimal glands: Secondary | ICD-10-CM | POA: Diagnosis not present

## 2016-09-21 DIAGNOSIS — H524 Presbyopia: Secondary | ICD-10-CM | POA: Diagnosis not present

## 2016-09-21 DIAGNOSIS — Z961 Presence of intraocular lens: Secondary | ICD-10-CM | POA: Diagnosis not present

## 2016-10-22 DIAGNOSIS — F325 Major depressive disorder, single episode, in full remission: Secondary | ICD-10-CM | POA: Diagnosis not present

## 2016-10-22 DIAGNOSIS — E039 Hypothyroidism, unspecified: Secondary | ICD-10-CM | POA: Diagnosis not present

## 2016-10-22 DIAGNOSIS — E7800 Pure hypercholesterolemia, unspecified: Secondary | ICD-10-CM | POA: Diagnosis not present

## 2016-10-22 DIAGNOSIS — N183 Chronic kidney disease, stage 3 (moderate): Secondary | ICD-10-CM | POA: Diagnosis not present

## 2016-10-22 DIAGNOSIS — Z1389 Encounter for screening for other disorder: Secondary | ICD-10-CM | POA: Diagnosis not present

## 2016-10-22 DIAGNOSIS — F5101 Primary insomnia: Secondary | ICD-10-CM | POA: Diagnosis not present

## 2016-10-22 DIAGNOSIS — E559 Vitamin D deficiency, unspecified: Secondary | ICD-10-CM | POA: Diagnosis not present

## 2016-10-22 DIAGNOSIS — Z0000 Encounter for general adult medical examination without abnormal findings: Secondary | ICD-10-CM | POA: Diagnosis not present

## 2016-11-26 DIAGNOSIS — M9903 Segmental and somatic dysfunction of lumbar region: Secondary | ICD-10-CM | POA: Diagnosis not present

## 2016-11-26 DIAGNOSIS — M545 Low back pain: Secondary | ICD-10-CM | POA: Diagnosis not present

## 2016-11-26 DIAGNOSIS — M542 Cervicalgia: Secondary | ICD-10-CM | POA: Diagnosis not present

## 2016-11-26 DIAGNOSIS — M9901 Segmental and somatic dysfunction of cervical region: Secondary | ICD-10-CM | POA: Diagnosis not present

## 2016-12-22 DIAGNOSIS — E039 Hypothyroidism, unspecified: Secondary | ICD-10-CM | POA: Diagnosis not present

## 2017-02-25 DIAGNOSIS — E039 Hypothyroidism, unspecified: Secondary | ICD-10-CM | POA: Diagnosis not present

## 2017-04-19 DIAGNOSIS — W57XXXA Bitten or stung by nonvenomous insect and other nonvenomous arthropods, initial encounter: Secondary | ICD-10-CM | POA: Diagnosis not present

## 2017-04-19 DIAGNOSIS — F5101 Primary insomnia: Secondary | ICD-10-CM | POA: Diagnosis not present

## 2017-04-19 DIAGNOSIS — E039 Hypothyroidism, unspecified: Secondary | ICD-10-CM | POA: Diagnosis not present

## 2017-04-19 DIAGNOSIS — M7662 Achilles tendinitis, left leg: Secondary | ICD-10-CM | POA: Diagnosis not present

## 2017-04-19 DIAGNOSIS — S50861A Insect bite (nonvenomous) of right forearm, initial encounter: Secondary | ICD-10-CM | POA: Diagnosis not present

## 2017-04-20 DIAGNOSIS — E039 Hypothyroidism, unspecified: Secondary | ICD-10-CM | POA: Diagnosis not present

## 2017-05-10 DIAGNOSIS — M542 Cervicalgia: Secondary | ICD-10-CM | POA: Diagnosis not present

## 2017-05-10 DIAGNOSIS — M9901 Segmental and somatic dysfunction of cervical region: Secondary | ICD-10-CM | POA: Diagnosis not present

## 2017-05-10 DIAGNOSIS — M545 Low back pain: Secondary | ICD-10-CM | POA: Diagnosis not present

## 2017-05-10 DIAGNOSIS — M9903 Segmental and somatic dysfunction of lumbar region: Secondary | ICD-10-CM | POA: Diagnosis not present

## 2017-05-18 DIAGNOSIS — G4700 Insomnia, unspecified: Secondary | ICD-10-CM | POA: Diagnosis not present

## 2017-05-18 DIAGNOSIS — E039 Hypothyroidism, unspecified: Secondary | ICD-10-CM | POA: Diagnosis not present

## 2017-05-18 DIAGNOSIS — E2839 Other primary ovarian failure: Secondary | ICD-10-CM | POA: Diagnosis not present

## 2017-06-17 DIAGNOSIS — E039 Hypothyroidism, unspecified: Secondary | ICD-10-CM | POA: Diagnosis not present

## 2017-06-28 DIAGNOSIS — M545 Low back pain: Secondary | ICD-10-CM | POA: Diagnosis not present

## 2017-06-28 DIAGNOSIS — M9901 Segmental and somatic dysfunction of cervical region: Secondary | ICD-10-CM | POA: Diagnosis not present

## 2017-06-28 DIAGNOSIS — M9904 Segmental and somatic dysfunction of sacral region: Secondary | ICD-10-CM | POA: Diagnosis not present

## 2017-06-28 DIAGNOSIS — M9903 Segmental and somatic dysfunction of lumbar region: Secondary | ICD-10-CM | POA: Diagnosis not present

## 2017-07-05 DIAGNOSIS — G4700 Insomnia, unspecified: Secondary | ICD-10-CM | POA: Diagnosis not present

## 2017-07-27 DIAGNOSIS — M8588 Other specified disorders of bone density and structure, other site: Secondary | ICD-10-CM | POA: Diagnosis not present

## 2017-07-27 DIAGNOSIS — E2839 Other primary ovarian failure: Secondary | ICD-10-CM | POA: Diagnosis not present

## 2017-08-04 DIAGNOSIS — E039 Hypothyroidism, unspecified: Secondary | ICD-10-CM | POA: Diagnosis not present

## 2017-10-13 DIAGNOSIS — Z98818 Other dental procedure status: Secondary | ICD-10-CM | POA: Diagnosis not present

## 2018-03-21 DIAGNOSIS — M9903 Segmental and somatic dysfunction of lumbar region: Secondary | ICD-10-CM | POA: Diagnosis not present

## 2018-03-21 DIAGNOSIS — M9904 Segmental and somatic dysfunction of sacral region: Secondary | ICD-10-CM | POA: Diagnosis not present

## 2018-03-21 DIAGNOSIS — M9901 Segmental and somatic dysfunction of cervical region: Secondary | ICD-10-CM | POA: Diagnosis not present

## 2018-03-21 DIAGNOSIS — M545 Low back pain: Secondary | ICD-10-CM | POA: Diagnosis not present

## 2018-04-08 ENCOUNTER — Other Ambulatory Visit: Payer: Self-pay | Admitting: Family Medicine

## 2018-04-08 DIAGNOSIS — Z1231 Encounter for screening mammogram for malignant neoplasm of breast: Secondary | ICD-10-CM

## 2018-04-25 DIAGNOSIS — N183 Chronic kidney disease, stage 3 (moderate): Secondary | ICD-10-CM | POA: Diagnosis not present

## 2018-04-25 DIAGNOSIS — Z0000 Encounter for general adult medical examination without abnormal findings: Secondary | ICD-10-CM | POA: Diagnosis not present

## 2018-04-25 DIAGNOSIS — F5101 Primary insomnia: Secondary | ICD-10-CM | POA: Diagnosis not present

## 2018-04-25 DIAGNOSIS — Z1389 Encounter for screening for other disorder: Secondary | ICD-10-CM | POA: Diagnosis not present

## 2018-04-25 DIAGNOSIS — E039 Hypothyroidism, unspecified: Secondary | ICD-10-CM | POA: Diagnosis not present

## 2018-04-25 DIAGNOSIS — E559 Vitamin D deficiency, unspecified: Secondary | ICD-10-CM | POA: Diagnosis not present

## 2018-04-25 DIAGNOSIS — E7800 Pure hypercholesterolemia, unspecified: Secondary | ICD-10-CM | POA: Diagnosis not present

## 2018-05-04 DIAGNOSIS — L738 Other specified follicular disorders: Secondary | ICD-10-CM | POA: Diagnosis not present

## 2018-05-04 DIAGNOSIS — L821 Other seborrheic keratosis: Secondary | ICD-10-CM | POA: Diagnosis not present

## 2018-05-04 DIAGNOSIS — Z23 Encounter for immunization: Secondary | ICD-10-CM | POA: Diagnosis not present

## 2018-05-18 ENCOUNTER — Ambulatory Visit
Admission: RE | Admit: 2018-05-18 | Discharge: 2018-05-18 | Disposition: A | Discharge status: Home / Self Care | Payer: PPO | Source: Ambulatory Visit | Attending: Family Medicine | Admitting: Family Medicine

## 2018-05-18 DIAGNOSIS — Z1231 Encounter for screening mammogram for malignant neoplasm of breast: Secondary | ICD-10-CM | POA: Diagnosis not present

## 2018-10-04 DIAGNOSIS — M542 Cervicalgia: Secondary | ICD-10-CM | POA: Diagnosis not present

## 2018-10-04 DIAGNOSIS — M5442 Lumbago with sciatica, left side: Secondary | ICD-10-CM | POA: Diagnosis not present

## 2018-10-04 DIAGNOSIS — M9901 Segmental and somatic dysfunction of cervical region: Secondary | ICD-10-CM | POA: Diagnosis not present

## 2018-10-04 DIAGNOSIS — M9903 Segmental and somatic dysfunction of lumbar region: Secondary | ICD-10-CM | POA: Diagnosis not present

## 2018-10-11 DIAGNOSIS — M9903 Segmental and somatic dysfunction of lumbar region: Secondary | ICD-10-CM | POA: Diagnosis not present

## 2018-10-11 DIAGNOSIS — M9901 Segmental and somatic dysfunction of cervical region: Secondary | ICD-10-CM | POA: Diagnosis not present

## 2018-10-11 DIAGNOSIS — M5442 Lumbago with sciatica, left side: Secondary | ICD-10-CM | POA: Diagnosis not present

## 2018-10-11 DIAGNOSIS — M542 Cervicalgia: Secondary | ICD-10-CM | POA: Diagnosis not present

## 2018-10-25 DIAGNOSIS — M9901 Segmental and somatic dysfunction of cervical region: Secondary | ICD-10-CM | POA: Diagnosis not present

## 2018-10-25 DIAGNOSIS — M5442 Lumbago with sciatica, left side: Secondary | ICD-10-CM | POA: Diagnosis not present

## 2018-10-25 DIAGNOSIS — M542 Cervicalgia: Secondary | ICD-10-CM | POA: Diagnosis not present

## 2018-10-25 DIAGNOSIS — M9903 Segmental and somatic dysfunction of lumbar region: Secondary | ICD-10-CM | POA: Diagnosis not present

## 2018-11-01 DIAGNOSIS — M542 Cervicalgia: Secondary | ICD-10-CM | POA: Diagnosis not present

## 2018-11-01 DIAGNOSIS — M9903 Segmental and somatic dysfunction of lumbar region: Secondary | ICD-10-CM | POA: Diagnosis not present

## 2018-11-01 DIAGNOSIS — M9901 Segmental and somatic dysfunction of cervical region: Secondary | ICD-10-CM | POA: Diagnosis not present

## 2018-11-01 DIAGNOSIS — M5442 Lumbago with sciatica, left side: Secondary | ICD-10-CM | POA: Diagnosis not present

## 2018-11-16 DIAGNOSIS — M6248 Contracture of muscle, other site: Secondary | ICD-10-CM | POA: Diagnosis not present

## 2018-11-16 DIAGNOSIS — R5383 Other fatigue: Secondary | ICD-10-CM | POA: Diagnosis not present

## 2018-11-16 DIAGNOSIS — S40862A Insect bite (nonvenomous) of left upper arm, initial encounter: Secondary | ICD-10-CM | POA: Diagnosis not present

## 2018-11-16 DIAGNOSIS — F439 Reaction to severe stress, unspecified: Secondary | ICD-10-CM | POA: Diagnosis not present

## 2018-11-16 DIAGNOSIS — R609 Edema, unspecified: Secondary | ICD-10-CM | POA: Diagnosis not present

## 2019-04-06 DIAGNOSIS — M7062 Trochanteric bursitis, left hip: Secondary | ICD-10-CM | POA: Diagnosis not present

## 2019-04-06 DIAGNOSIS — M25552 Pain in left hip: Secondary | ICD-10-CM | POA: Diagnosis not present

## 2019-04-11 ENCOUNTER — Other Ambulatory Visit: Payer: Self-pay | Admitting: Family Medicine

## 2019-04-11 DIAGNOSIS — Z1231 Encounter for screening mammogram for malignant neoplasm of breast: Secondary | ICD-10-CM

## 2019-05-01 DIAGNOSIS — H524 Presbyopia: Secondary | ICD-10-CM | POA: Diagnosis not present

## 2019-05-01 DIAGNOSIS — E7800 Pure hypercholesterolemia, unspecified: Secondary | ICD-10-CM | POA: Diagnosis not present

## 2019-05-01 DIAGNOSIS — N1831 Chronic kidney disease, stage 3a: Secondary | ICD-10-CM | POA: Diagnosis not present

## 2019-05-01 DIAGNOSIS — Z961 Presence of intraocular lens: Secondary | ICD-10-CM | POA: Diagnosis not present

## 2019-05-01 DIAGNOSIS — Z1389 Encounter for screening for other disorder: Secondary | ICD-10-CM | POA: Diagnosis not present

## 2019-05-01 DIAGNOSIS — G4700 Insomnia, unspecified: Secondary | ICD-10-CM | POA: Diagnosis not present

## 2019-05-01 DIAGNOSIS — E039 Hypothyroidism, unspecified: Secondary | ICD-10-CM | POA: Diagnosis not present

## 2019-05-01 DIAGNOSIS — Z0000 Encounter for general adult medical examination without abnormal findings: Secondary | ICD-10-CM | POA: Diagnosis not present

## 2019-05-22 DIAGNOSIS — M542 Cervicalgia: Secondary | ICD-10-CM | POA: Diagnosis not present

## 2019-05-22 DIAGNOSIS — M9903 Segmental and somatic dysfunction of lumbar region: Secondary | ICD-10-CM | POA: Diagnosis not present

## 2019-05-22 DIAGNOSIS — M5442 Lumbago with sciatica, left side: Secondary | ICD-10-CM | POA: Diagnosis not present

## 2019-05-22 DIAGNOSIS — M9901 Segmental and somatic dysfunction of cervical region: Secondary | ICD-10-CM | POA: Diagnosis not present

## 2019-06-05 ENCOUNTER — Other Ambulatory Visit: Payer: Self-pay

## 2019-06-05 ENCOUNTER — Ambulatory Visit
Admission: RE | Admit: 2019-06-05 | Discharge: 2019-06-05 | Disposition: A | Discharge status: Home / Self Care | Payer: PPO | Source: Ambulatory Visit | Attending: Family Medicine | Admitting: Family Medicine

## 2019-06-05 DIAGNOSIS — Z1231 Encounter for screening mammogram for malignant neoplasm of breast: Secondary | ICD-10-CM

## 2019-06-19 DIAGNOSIS — M545 Low back pain: Secondary | ICD-10-CM | POA: Diagnosis not present

## 2019-06-19 DIAGNOSIS — M542 Cervicalgia: Secondary | ICD-10-CM | POA: Diagnosis not present

## 2019-06-19 DIAGNOSIS — M9901 Segmental and somatic dysfunction of cervical region: Secondary | ICD-10-CM | POA: Diagnosis not present

## 2019-06-19 DIAGNOSIS — M9903 Segmental and somatic dysfunction of lumbar region: Secondary | ICD-10-CM | POA: Diagnosis not present

## 2019-09-13 DIAGNOSIS — D649 Anemia, unspecified: Secondary | ICD-10-CM | POA: Diagnosis not present

## 2019-09-13 DIAGNOSIS — F325 Major depressive disorder, single episode, in full remission: Secondary | ICD-10-CM | POA: Diagnosis not present

## 2019-09-13 DIAGNOSIS — N183 Chronic kidney disease, stage 3 unspecified: Secondary | ICD-10-CM | POA: Diagnosis not present

## 2019-09-13 DIAGNOSIS — G4700 Insomnia, unspecified: Secondary | ICD-10-CM | POA: Diagnosis not present

## 2019-09-13 DIAGNOSIS — J45991 Cough variant asthma: Secondary | ICD-10-CM | POA: Diagnosis not present

## 2019-09-13 DIAGNOSIS — E039 Hypothyroidism, unspecified: Secondary | ICD-10-CM | POA: Diagnosis not present

## 2019-09-13 DIAGNOSIS — E7800 Pure hypercholesterolemia, unspecified: Secondary | ICD-10-CM | POA: Diagnosis not present

## 2019-09-13 DIAGNOSIS — F329 Major depressive disorder, single episode, unspecified: Secondary | ICD-10-CM | POA: Diagnosis not present

## 2020-01-01 ENCOUNTER — Telehealth: Payer: Self-pay | Admitting: Allergy & Immunology

## 2020-01-01 NOTE — Telephone Encounter (Signed)
[name] called and would like to be tested for the covid before she gets her shot. She thinks she may have  allergic to the compote in the shot. [phone].

## 2020-01-02 NOTE — Telephone Encounter (Signed)
Spoke with patient for almost 30 min's going over COVID Component testing and explain the COVID Vaccine Algorithm. Patient was extremely nervous of getting the vaccine due to other allergy symptoms, food sensitivity, and chemical exposures. Patient had a chemical exposure 15 years or so ago which caused her to have chemical poisoning at that time where patient stayed hospitalized for several weeks in ICU. Patient also stated she has some food sensitivity and was not sure if anything in that nature will be correlated to  PEG or other factors the vaccine may have. Patient state she never had a reaction to any vaccination but a few years ago she did get the shingles vaccines and claims this caused her to get the shingles. I did go over our Algorithm and explain that she answered No to all the questions related to derma fillers, vaccination anaphylaxis, Miralax consumption and other allergies she was able to get the vaccine. Patient is hesitant of getting the vaccine and would like to speak to a provider regarding this and wanted to get component testing just to make sure she doesn't have a reaction due to her past history. I  informed her that patients normally get the vaccine component testing if they have answered yes to any of the Algorithm, or if they had any issues getting their first vaccination. Patient would like a provider to go into full details and cost to see if she qualify's. Please advise on this.

## 2020-01-03 NOTE — Telephone Encounter (Signed)
Left voicemail to schedule new patient appointment to discuss component testing.

## 2020-01-03 NOTE — Telephone Encounter (Signed)
Can we schedule for a New Patient slot to discuss further? We could do a televisit as long as we can do video.   Salvatore Marvel, MD [facility]

## 2020-01-08 NOTE — Telephone Encounter (Signed)
Patient's insurance called and needed more information about allergy testing and COVID Component testing. Please call insurance back at [phone] with reference number #[id number].

## 2020-01-09 NOTE — Telephone Encounter (Signed)
Patient is interested in getting COVID component testing done and I assume she called insurance to get pricing information. Insurance company called to get a better understanding/ codes for this testing.

## 2020-01-09 NOTE — Telephone Encounter (Signed)
Please advise to codes and insurance coverage we generally do not call the insurances if you can help in this matter I would be thankful

## 2020-01-09 NOTE — Telephone Encounter (Signed)
Spoke with Mel Almond, pt will need to contact insurance her self about the pricing for the testing

## 2020-01-09 NOTE — Telephone Encounter (Signed)
We generally do not call insurances do you know exactly what they needed and why?

## 2020-01-10 NOTE — Telephone Encounter (Signed)
I have spoken with patient in regards to the testing. Her insurance company did call me but we keep playing phone tag. But I did give [name] all the information she would need.

## 2020-04-29 ENCOUNTER — Other Ambulatory Visit: Payer: Self-pay | Admitting: Family Medicine

## 2020-04-29 DIAGNOSIS — Z1231 Encounter for screening mammogram for malignant neoplasm of breast: Secondary | ICD-10-CM

## 2020-05-02 DIAGNOSIS — Z0000 Encounter for general adult medical examination without abnormal findings: Secondary | ICD-10-CM | POA: Diagnosis not present

## 2020-05-02 DIAGNOSIS — Z1389 Encounter for screening for other disorder: Secondary | ICD-10-CM | POA: Diagnosis not present

## 2020-05-02 DIAGNOSIS — E7800 Pure hypercholesterolemia, unspecified: Secondary | ICD-10-CM | POA: Diagnosis not present

## 2020-05-02 DIAGNOSIS — F5101 Primary insomnia: Secondary | ICD-10-CM | POA: Diagnosis not present

## 2020-05-02 DIAGNOSIS — Z23 Encounter for immunization: Secondary | ICD-10-CM | POA: Diagnosis not present

## 2020-05-02 DIAGNOSIS — N183 Chronic kidney disease, stage 3 unspecified: Secondary | ICD-10-CM | POA: Diagnosis not present

## 2020-05-02 DIAGNOSIS — E039 Hypothyroidism, unspecified: Secondary | ICD-10-CM | POA: Diagnosis not present

## 2020-05-21 DIAGNOSIS — M9901 Segmental and somatic dysfunction of cervical region: Secondary | ICD-10-CM | POA: Diagnosis not present

## 2020-05-21 DIAGNOSIS — M542 Cervicalgia: Secondary | ICD-10-CM | POA: Diagnosis not present

## 2020-05-21 DIAGNOSIS — M9903 Segmental and somatic dysfunction of lumbar region: Secondary | ICD-10-CM | POA: Diagnosis not present

## 2020-05-29 DIAGNOSIS — Z961 Presence of intraocular lens: Secondary | ICD-10-CM | POA: Diagnosis not present

## 2020-05-29 DIAGNOSIS — H524 Presbyopia: Secondary | ICD-10-CM | POA: Diagnosis not present

## 2020-06-06 ENCOUNTER — Other Ambulatory Visit: Payer: Self-pay

## 2020-06-06 ENCOUNTER — Ambulatory Visit
Admission: RE | Admit: 2020-06-06 | Discharge: 2020-06-06 | Disposition: A | Discharge status: Home / Self Care | Payer: PPO | Source: Ambulatory Visit | Attending: Family Medicine | Admitting: Family Medicine

## 2020-06-06 ENCOUNTER — Ambulatory Visit: Payer: PPO

## 2020-06-06 DIAGNOSIS — Z1231 Encounter for screening mammogram for malignant neoplasm of breast: Secondary | ICD-10-CM

## 2020-06-12 ENCOUNTER — Ambulatory Visit: Payer: PPO

## 2020-10-28 DIAGNOSIS — F5101 Primary insomnia: Secondary | ICD-10-CM | POA: Diagnosis not present

## 2020-10-28 DIAGNOSIS — M65331 Trigger finger, right middle finger: Secondary | ICD-10-CM | POA: Diagnosis not present

## 2020-10-28 DIAGNOSIS — R55 Syncope and collapse: Secondary | ICD-10-CM | POA: Diagnosis not present

## 2020-10-28 DIAGNOSIS — M1811 Unilateral primary osteoarthritis of first carpometacarpal joint, right hand: Secondary | ICD-10-CM | POA: Diagnosis not present

## 2020-10-28 DIAGNOSIS — E039 Hypothyroidism, unspecified: Secondary | ICD-10-CM | POA: Diagnosis not present

## 2021-01-27 DIAGNOSIS — M65331 Trigger finger, right middle finger: Secondary | ICD-10-CM | POA: Diagnosis not present

## 2021-01-27 DIAGNOSIS — M13841 Other specified arthritis, right hand: Secondary | ICD-10-CM | POA: Diagnosis not present

## 2021-01-27 DIAGNOSIS — M79644 Pain in right finger(s): Secondary | ICD-10-CM | POA: Diagnosis not present

## 2021-03-11 DIAGNOSIS — M9901 Segmental and somatic dysfunction of cervical region: Secondary | ICD-10-CM | POA: Diagnosis not present

## 2021-03-11 DIAGNOSIS — M9902 Segmental and somatic dysfunction of thoracic region: Secondary | ICD-10-CM | POA: Diagnosis not present

## 2021-03-11 DIAGNOSIS — M546 Pain in thoracic spine: Secondary | ICD-10-CM | POA: Diagnosis not present

## 2021-03-11 DIAGNOSIS — M9903 Segmental and somatic dysfunction of lumbar region: Secondary | ICD-10-CM | POA: Diagnosis not present

## 2021-04-03 ENCOUNTER — Other Ambulatory Visit: Payer: Self-pay | Admitting: Family Medicine

## 2021-04-03 DIAGNOSIS — Z1231 Encounter for screening mammogram for malignant neoplasm of breast: Secondary | ICD-10-CM

## 2021-04-15 DIAGNOSIS — R000 Tachycardia, unspecified: Secondary | ICD-10-CM | POA: Diagnosis not present

## 2021-04-23 ENCOUNTER — Other Ambulatory Visit: Payer: Self-pay

## 2021-04-23 ENCOUNTER — Encounter: Payer: Self-pay | Admitting: *Deleted

## 2021-04-23 ENCOUNTER — Encounter: Payer: Self-pay | Admitting: Internal Medicine

## 2021-04-23 ENCOUNTER — Ambulatory Visit: Payer: PPO | Admitting: Internal Medicine

## 2021-04-23 VITALS — BP 90/66 | HR 73 | Ht 64.0 in | Wt 139.6 lb

## 2021-04-23 DIAGNOSIS — R9431 Abnormal electrocardiogram [ECG] [EKG]: Secondary | ICD-10-CM | POA: Diagnosis not present

## 2021-04-23 DIAGNOSIS — R002 Palpitations: Secondary | ICD-10-CM

## 2021-04-23 NOTE — Progress Notes (Signed)
Cardiology Office Note:    Date:  04/23/2021   ID:  [name], DOB 07-29-1940, MRN [id number]  PCP:  Emma Ada, MD   [facility] Providers Cardiologist:  None     Referring MD: Emma Ada, MD   No chief complaint on file. Palpitations  History of Present Illness:    [name] is a 80 y.o. female with a hx of depression referral for palpitation   She noted that 03/26/2021 she noted that her pulse was too fast when she was in the kitchen. This happens on and off. She noted eating lunch with HR 60-98. She has limited exercise with pulse rate that increased. Her exercise is limited with the high heart rates, it increases faster. She had no dyspnea on exertion or chest pain. She notes pressure, and LH. Her heart rate was 107 bpm. She's had some LH in the shower. No syncope. She spent the afternoon resting on the sofa. She was in the kitchen making good. An hour ago it was beating fast. It is sporadic. Blood pressure is well controlled.Her EKG was noted to have low voltage and she was referred to cardiology. She has no cardiac dx history. Her father had CABG and he was diabetic. She was told to refrain from caffiene, she does not drink coffee. No etoh. No anginal symptoms in the past. No hx of stress test, no LHC. No smoking history. No history of radiation for papillary carcinoma. She had breast calcified lesion with lumpectomy. No chemotherapy. No stroke history. 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 She hydrates well. She was hospital Chaplain prior and sales, and sold furniture. She moved here to be closer to her daughter.   EKG 2013- NSR, septal infarct pattern  Past Medical History:  Diagnosis Date   Depression    Headache    Hyperlipidemia    Hypothyroidism    Osteopenia    Papillary carcinoma ([place])    of thyroid-had thyroidectomy   Papilloma of breast    left    Past Surgical History:  Procedure Laterality Date   ABDOMINAL HYSTERECTOMY      BREAST BIOPSY Left 2017   BREAST EXCISIONAL BIOPSY Left 2017   BREAST LUMPECTOMY WITH RADIOACTIVE SEED LOCALIZATION Left 02/20/2016   Procedure: LEFT BREAST LUMPECTOMY WITH RADIOACTIVE SEED LOCALIZATION;  Surgeon: Jackolyn Confer, MD;  Location: [place];  Service: General;  Laterality: Left;  LEFT BREAST LUMPECTOMY WITH RADIOACTIVE SEED LOCALIZATION   CATARACT EXTRACTION     CHOLECYSTECTOMY     HEMORROIDECTOMY     JOINT REPLACEMENT Left    THYROIDECTOMY     TOTAL HIP ARTHROPLASTY      Current Medications: No outpatient medications have been marked as taking for the 04/23/21 encounter (Office Visit) with Janina Mayo, MD.     Allergies:   Sulfa antibiotics and Sulfasalazine   Social History   Socioeconomic History   Marital status: Single    Spouse name: Not on file   Number of children: Not on file   Years of education: Not on file   Highest education level: Not on file  Occupational History   Not on file  Tobacco Use   Smoking status: Never   Smokeless tobacco: Never  Substance and Sexual Activity   Alcohol use: No   Drug use: No   Sexual activity: Not on file  Other Topics Concern   Not on file  Social History Narrative  Not on file   Social Determinants of Health   Financial Resource Strain: Not on file  Food Insecurity: Not on file  Transportation Needs: Not on file  Physical Activity: Not on file  Stress: Not on file  Social Connections: Not on file     Family History: The patient's family history includes Breast cancer in her paternal aunt; Cancer in her paternal aunt.  ROS:   Please see the history of present illness.     All other systems reviewed and are negative.  EKGs/Labs/Other Studies Reviewed:    The following studies were reviewed today:   EKG:  EKG is  ordered today.  The ekg ordered today demonstrates  Nsr, septal infarct pattern unchanged since 2013  Recent Labs: No results found for requested labs within last 8760  hours.  Recent Lipid Panel No results found for: CHOL, TRIG, HDL, CHOLHDL, VLDL, LDLCALC, LDLDIRECT   Risk Assessment/Calculations:           Physical Exam:    VS:  Vitals:   04/23/21 1038  BP: 90/66  Pulse: 73  SpO2: 99%     Wt Readings from Last 3 Encounters:  02/20/16 140 lb (63.5 kg)  04/22/15 141 lb 4 oz (64.1 kg)  04/12/13 145 lb 12.8 oz (66.1 kg)     GEN:  Well nourished, well developed in no acute distress HEENT: Normal NECK: No JVD; No carotid bruits LYMPHATICS: No lymphadenopathy CARDIAC: RRR, no murmurs, rubs, gallops RESPIRATORY:  Clear to auscultation without rales, wheezing or rhonchi  ABDOMEN: Soft, non-tender, non-distended MUSCULOSKELETAL:  No edema; No deformity  SKIN: Warm and dry NEUROLOGIC:  Alert and oriented x 3 PSYCHIATRIC:  Normal affect   ASSESSMENT:    1. Abnormal EKG   - She has unchanged EKG today, low concern for acute ischemia. No signs of CHF. No low voltage  #Palpitations: She is having persistent palpitations limiting her physical activity. Will obtain cardiac event monitor to assess for SVT/Atrial fibrillation as well as obtain TTE.  PLAN:    In order of problems listed above:  Cardiac event monitor 2 weeks TTE Follow up 3 months       Medication Adjustments/Labs and Tests Ordered: Current medicines are reviewed at length with the patient today.  Concerns regarding medicines are outlined above.  Orders Placed This Encounter  Procedures   EKG 12-Lead    Signed, Janina Mayo, MD  04/23/2021 10:37 AM    [facility]

## 2021-04-23 NOTE — Progress Notes (Signed)
Patient ID: [name], female   DOB: 04-29-1941, 80 y.o.   MRN: [id number] Patient enrolled for [employer] to ship a 14 day cardiac event monitor to her address on file.

## 2021-04-23 NOTE — Patient Instructions (Signed)
Medication Instructions:  No Changes In Medications at this time.  *If you need a refill on your cardiac medications before your next appointment, please call your pharmacy*  Testing/Procedures: Your physician has requested that you have an echocardiogram. Echocardiography is a painless test that uses sound waves to create images of your heart. It provides your doctor with information about the size and shape of your heart and how well your heart's chambers and valves are working. You may receive an ultrasound enhancing agent through an IV if needed to better visualize your heart during the echo.This procedure takes approximately one hour. There are no restrictions for this procedure. This will take place at the [address].   [employer] Cardiac Event Monitor Instructions Your physician has requested you wear your cardiac event monitor for 14 days. [employer] may call or text to confirm a shipping address. The monitor will be sent to a land address via UPS. [employer] will not ship a monitor to a PO BOX. It typically takes 3-5 days to receive your monitor after it has been enrolled. [employer] will assist with USPS tracking if your package is delayed. The telephone number for [employer] is [phone]. Once you have received your monitor, please review the enclosed instructions. Instruction tutorials can also be viewed under help and settings on the enclosed cell phone. Your monitor has already been registered assigning a specific monitor serial # to you.  Applying the monitor Remove cell phone from case and turn it on. The cell phone works as Dealer and needs to be within [employer] of you at all times. The cell phone will need to be charged on a daily basis. We recommend you plug the cell phone into the enclosed charger at your bedside table every night.  Monitor batteries: You will receive two monitor batteries labelled #1 and #2. These are your recorders.  Plug battery #2 onto the second connection on the enclosed charger. Keep one battery on the charger at all times. This will keep the monitor battery deactivated. It will also keep it fully charged for when you need to switch your monitor batteries. A small light will be blinking on the battery emblem when it is charging. The light on the battery emblem will remain on when the battery is fully charged.  Open package of a Monitor strip. Insert battery #1 into black hood on strip and gently squeeze monitor battery onto connection as indicated in instruction booklet. Set aside while preparing skin.  Choose location for your strip, vertical or horizontal, as indicated in the instruction booklet. Shave to remove all hair from location. There cannot be any lotions, oils, powders, or colognes on skin where monitor is to be applied. Wipe skin clean with enclosed Saline wipe. Dry skin completely.  Peel paper labeled #1 off the back of the Monitor strip exposing the adhesive. Place the monitor on the chest in the vertical or horizontal position shown in the instruction booklet. One arrow on the monitor strip must be pointing upward. Carefully remove paper labeled #2, attaching remainder of strip to your skin. Try not to create any folds or wrinkles in the strip as you apply it.  Firmly press and release the circle in the center of the monitor battery. You will hear a small beep. This is turning the monitor battery on. The heart emblem on the monitor battery will light up every 5 seconds if the monitor battery in turned on and connected to the patient securely. Do  not push and hold the circle down as this turns the monitor battery off. The cell phone will locate the monitor battery. A screen will appear on the cell phone checking the connection of your monitor strip. This may read poor connection initially but change to good connection within the next minute. Once your monitor accepts the connection you  will hear a series of 3 beeps followed by a climbing crescendo of beeps. A screen will appear on the cell phone showing the two monitor strip placement options. Touch the picture that demonstrates where you applied the monitor strip.  Your monitor strip and battery are waterproof. You are able to shower, bathe, or swim with the monitor on. They just ask you do not submerge deeper than 3 feet underwater. We recommend removing the monitor if you are swimming in a lake, river, or ocean.  Your monitor battery will need to be switched to a fully charged monitor battery approximately once a week. The cell phone will alert you of an action which needs to be made.  On the cell phone, tap for details to reveal connection status, monitor battery status, and cell phone battery status. The green dots indicates your monitor is in good status. A red dot indicates there is something that needs your attention.  To record a symptom, click the circle on the monitor battery. In 30-60 seconds a list of symptoms will appear on the cell phone. Select your symptom and tap save. Your monitor will record a sustained or significant arrhythmia regardless of you clicking the button. Some patients do not feel the heart rhythm irregularities. [employer] will notify us of any serious or critical events.  Refer to instruction booklet for instructions on switching batteries, changing strips, the Do not disturb or Pause features, or any additional questions.  Call [employer] at [phone], to confirm your monitor is transmitting and record your baseline. They will answer any questions you may have regarding the monitor instructions at that time.  Returning the monitor to [place] all equipment back into blue box. Peel off strip of paper to expose adhesive and close box securely. There is a prepaid UPS shipping label on this box. Drop in a UPS drop box, or at a UPS facility like [employer]. You may also contact  [employer] to arrange UPS to pick up monitor package at your home.   Follow-Up: At [facility], you and your health needs are our priority.  As part of our continuing mission to provide you with exceptional heart care, we have created designated Provider Care Teams.  These Care Teams include your primary Cardiologist (physician) and Advanced Practice Providers (APPs -  Physician Assistants and Nurse Practitioners) who all work together to provide you with the care you need, when you need it.  Your next appointment:   3 month(s)  The format for your next appointment:   In Person  Provider:   Janina Mayo, MD {

## 2021-04-30 ENCOUNTER — Ambulatory Visit (INDEPENDENT_AMBULATORY_CARE_PROVIDER_SITE_OTHER): Payer: PPO

## 2021-04-30 DIAGNOSIS — R9431 Abnormal electrocardiogram [ECG] [EKG]: Secondary | ICD-10-CM | POA: Diagnosis not present

## 2021-04-30 DIAGNOSIS — R002 Palpitations: Secondary | ICD-10-CM | POA: Diagnosis not present

## 2021-05-05 ENCOUNTER — Telehealth: Payer: Self-pay | Admitting: Internal Medicine

## 2021-05-05 NOTE — Telephone Encounter (Signed)
Patient states her heart monitor is causing her to have a rash. She states she removed it and placed it in a different spot and she wanted to inform Dr. Harl Bowie.

## 2021-05-05 NOTE — Telephone Encounter (Signed)
Returned call to patient, patient states she moved her preventice monitor to a different location due to irritation and itching.   The spot she moved it to is now irritated and itching as well.  Provided # to call [employer] to see if they could send her sensitive skin electrodes.  Patient aware.

## 2021-05-12 ENCOUNTER — Ambulatory Visit (HOSPITAL_COMMUNITY): Payer: PPO | Attending: Cardiology

## 2021-05-12 ENCOUNTER — Other Ambulatory Visit: Payer: Self-pay

## 2021-05-12 DIAGNOSIS — R002 Palpitations: Secondary | ICD-10-CM | POA: Insufficient documentation

## 2021-05-12 DIAGNOSIS — R9431 Abnormal electrocardiogram [ECG] [EKG]: Secondary | ICD-10-CM | POA: Diagnosis not present

## 2021-05-12 LAB — ECHOCARDIOGRAM COMPLETE
Area-P 1/2: 4.52 cm2
P 1/2 time: 553 msec
S' Lateral: 2.2 cm

## 2021-05-14 DIAGNOSIS — Z0000 Encounter for general adult medical examination without abnormal findings: Secondary | ICD-10-CM | POA: Diagnosis not present

## 2021-05-14 DIAGNOSIS — E039 Hypothyroidism, unspecified: Secondary | ICD-10-CM | POA: Diagnosis not present

## 2021-05-14 DIAGNOSIS — E7800 Pure hypercholesterolemia, unspecified: Secondary | ICD-10-CM | POA: Diagnosis not present

## 2021-05-14 DIAGNOSIS — Z1159 Encounter for screening for other viral diseases: Secondary | ICD-10-CM | POA: Diagnosis not present

## 2021-05-14 DIAGNOSIS — Z1389 Encounter for screening for other disorder: Secondary | ICD-10-CM | POA: Diagnosis not present

## 2021-05-14 DIAGNOSIS — N183 Chronic kidney disease, stage 3 unspecified: Secondary | ICD-10-CM | POA: Diagnosis not present

## 2021-05-20 DIAGNOSIS — U071 COVID-19: Secondary | ICD-10-CM | POA: Diagnosis not present

## 2021-05-23 ENCOUNTER — Telehealth: Payer: Self-pay | Admitting: Internal Medicine

## 2021-05-23 NOTE — Telephone Encounter (Signed)
Patient call to get update on monitor results. Please advise

## 2021-05-23 NOTE — Telephone Encounter (Signed)
The patient has been notified of the result and verbalized understanding.  All questions (if any) were answered. Beatrix Fetters, RN 05/23/2021 1:25 PM

## 2021-06-04 DIAGNOSIS — H524 Presbyopia: Secondary | ICD-10-CM | POA: Diagnosis not present

## 2021-06-04 DIAGNOSIS — Z961 Presence of intraocular lens: Secondary | ICD-10-CM | POA: Diagnosis not present

## 2021-06-10 ENCOUNTER — Ambulatory Visit
Admission: RE | Admit: 2021-06-10 | Discharge: 2021-06-10 | Disposition: A | Discharge status: Home / Self Care | Payer: PPO | Source: Ambulatory Visit | Attending: Family Medicine | Admitting: Family Medicine

## 2021-06-10 DIAGNOSIS — Z1231 Encounter for screening mammogram for malignant neoplasm of breast: Secondary | ICD-10-CM

## 2021-07-21 ENCOUNTER — Other Ambulatory Visit: Payer: Self-pay

## 2021-07-21 ENCOUNTER — Encounter: Payer: Self-pay | Admitting: Internal Medicine

## 2021-07-21 ENCOUNTER — Ambulatory Visit: Payer: PPO | Admitting: Internal Medicine

## 2021-07-21 VITALS — BP 132/84 | HR 76 | Ht 64.0 in | Wt 143.4 lb

## 2021-07-21 DIAGNOSIS — R002 Palpitations: Secondary | ICD-10-CM

## 2021-07-21 NOTE — Patient Instructions (Signed)

## 2021-07-21 NOTE — Progress Notes (Signed)
Cardiology Office Note:    Date:  07/21/2021   ID:  [name], DOB 1941/06/08, MRN [id number]  PCP:  Emma Ada, MD   [facility] Providers Cardiologist:  Emma Mayo, MD     Referring MD: Emma Ada, MD   No chief complaint on file. Palpitations  History of Present Illness:    [name] is a 81 y.o. female with a hx of depression referral for palpitation   She noted that 03/26/2021 she noted that her pulse was too fast when she was in the kitchen. This happens on and off. She noted eating lunch with HR 60-98. She has limited exercise with pulse rate that increased. Her exercise is limited with the high heart rates, it increases faster. She had no dyspnea on exertion or chest pain. She notes pressure, and LH. Her heart rate was 107 bpm. She's had some LH in the shower. No syncope. She spent the afternoon resting on the sofa. She was in the kitchen making good. An hour ago it was beating fast. It is sporadic. Blood pressure is well controlled.Her EKG was noted to have low voltage and she was referred to cardiology. She has no cardiac dx history. Her father had CABG and he was diabetic. She was told to refrain from caffiene, she does not drink coffee. No etoh. No anginal symptoms in the past. No hx of stress test, no LHC. No smoking history. No history of radiation for papillary carcinoma. She had breast calcified lesion with lumpectomy. No chemotherapy. No stroke history. 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 She hydrates well. She was hospital Chaplain prior and sales, and sold furniture. She moved here to be closer to her daughter.   EKG 2013- NSR, septal infarct pattern  Interim Hx: [name] returns after her ziopatch showed triggered events for sinus rhythm. She had some PACs and short runs of AT.  She notes still having some sensation of fast heart beats. She does not drink caffeine. Still no report of LH, dizziness or syncope.   Past Medical  History:  Diagnosis Date   Depression    Headache    Hyperlipidemia    Hypothyroidism    Osteopenia    Papillary carcinoma ([place])    of thyroid-had thyroidectomy   Papilloma of breast    left    Past Surgical History:  Procedure Laterality Date   ABDOMINAL HYSTERECTOMY     BREAST BIOPSY Left 2017   BREAST EXCISIONAL BIOPSY Left 2017   BREAST LUMPECTOMY WITH RADIOACTIVE SEED LOCALIZATION Left 02/20/2016   Procedure: LEFT BREAST LUMPECTOMY WITH RADIOACTIVE SEED LOCALIZATION;  Surgeon: Jackolyn Confer, MD;  Location: [place];  Service: General;  Laterality: Left;  LEFT BREAST LUMPECTOMY WITH RADIOACTIVE SEED LOCALIZATION   CATARACT EXTRACTION     CHOLECYSTECTOMY     HEMORROIDECTOMY     JOINT REPLACEMENT Left    THYROIDECTOMY     TOTAL HIP ARTHROPLASTY      Current Medications: Current Meds  Medication Sig   levothyroxine (SYNTHROID) 50 MCG tablet TAKE 1.5 TAB DAILY EXCEPT 1 TAB ON TUESDAY AND THURSDAYS ONCE A DAY AS DIRECTED ON EMPTY STOMACH   traZODone (DESYREL) 50 MG tablet Take 50 mg by mouth at bedtime.     Allergies:   Sulfa antibiotics and Sulfasalazine   Social History   Socioeconomic History   Marital status: Single    Spouse name: Not on file   Number of  children: Not on file   Years of education: Not on file   Highest education level: Not on file  Occupational History   Not on file  Tobacco Use   Smoking status: Never   Smokeless tobacco: Never  Substance and Sexual Activity   Alcohol use: No   Drug use: No   Sexual activity: Not on file  Other Topics Concern   Not on file  Social History Narrative   Not on file   Social Determinants of Health   Financial Resource Strain: Not on file  Food Insecurity: Not on file  Transportation Needs: Not on file  Physical Activity: Not on file  Stress: Not on file  Social Connections: Not on file     Family History: The patient's family history includes Breast cancer in her paternal aunt;  Cancer in her paternal aunt.  ROS:   Please see the history of present illness.     All other systems reviewed and are negative.  EKGs/Labs/Other Studies Reviewed:    The following studies were reviewed today:   EKG:  EKG is  ordered today.  The ekg ordered today demonstrates   Nsr, septal infarct pattern unchanged since 2013  Recent Labs: No results found for requested labs within last 8760 hours.  Recent Lipid Panel No results found for: CHOL, TRIG, HDL, CHOLHDL, VLDL, LDLCALC, LDLDIRECT   Risk Assessment/Calculations:           Physical Exam:    VS:  Vitals:   07/21/21 0937  BP: 132/84  Pulse: 76  SpO2: 98%     Wt Readings from Last 3 Encounters:  07/21/21 143 lb 6.4 oz (65 kg)  04/23/21 139 lb 9.6 oz (63.3 kg)  02/20/16 140 lb (63.5 kg)     GEN:  Well nourished, well developed in no acute distress HEENT: Normal NECK: No JVD; No carotid bruits LYMPHATICS: No lymphadenopathy CARDIAC: RRR, no murmurs, rubs, gallops RESPIRATORY:  Clear to auscultation without rales, wheezing or rhonchi  ABDOMEN: Soft, non-tender, non-distended MUSCULOSKELETAL:  No edema; No deformity  SKIN: Warm and dry NEUROLOGIC:  Alert and oriented x 3 PSYCHIATRIC:  Normal affect   ASSESSMENT:   #Palpitations: She underwent cardiac monitor which did not show significant arrhythmia. She had some PACs and short runs of AT. Her echo showed normal function. She has no valve dx. We discussed her results. If her symptoms become burdensome, she can start a low does BB; otherwise she can follow up PRN  PLAN:    In order of problems listed above:  Follow up PRN   Medication Adjustments/Labs and Tests Ordered: Current medicines are reviewed at length with the patient today.  Concerns regarding medicines are outlined above.  No orders of the defined types were placed in this encounter.   Signed, Emma Mayo, MD  07/21/2021 2:08 PM    [facility]

## 2021-08-01 ENCOUNTER — Ambulatory Visit: Payer: PPO | Admitting: Internal Medicine

## 2021-09-01 DIAGNOSIS — M65331 Trigger finger, right middle finger: Secondary | ICD-10-CM | POA: Diagnosis not present

## 2021-09-01 DIAGNOSIS — M13849 Other specified arthritis, unspecified hand: Secondary | ICD-10-CM | POA: Diagnosis not present

## 2021-09-01 DIAGNOSIS — G5601 Carpal tunnel syndrome, right upper limb: Secondary | ICD-10-CM | POA: Diagnosis not present

## 2021-09-01 DIAGNOSIS — M1811 Unilateral primary osteoarthritis of first carpometacarpal joint, right hand: Secondary | ICD-10-CM | POA: Diagnosis not present

## 2021-09-16 DIAGNOSIS — M9904 Segmental and somatic dysfunction of sacral region: Secondary | ICD-10-CM | POA: Diagnosis not present

## 2021-09-16 DIAGNOSIS — M9903 Segmental and somatic dysfunction of lumbar region: Secondary | ICD-10-CM | POA: Diagnosis not present

## 2021-09-16 DIAGNOSIS — M9901 Segmental and somatic dysfunction of cervical region: Secondary | ICD-10-CM | POA: Diagnosis not present

## 2021-09-16 DIAGNOSIS — M542 Cervicalgia: Secondary | ICD-10-CM | POA: Diagnosis not present

## 2021-10-28 DIAGNOSIS — M9901 Segmental and somatic dysfunction of cervical region: Secondary | ICD-10-CM | POA: Diagnosis not present

## 2021-10-28 DIAGNOSIS — M9904 Segmental and somatic dysfunction of sacral region: Secondary | ICD-10-CM | POA: Diagnosis not present

## 2021-10-28 DIAGNOSIS — M542 Cervicalgia: Secondary | ICD-10-CM | POA: Diagnosis not present

## 2021-10-28 DIAGNOSIS — M9903 Segmental and somatic dysfunction of lumbar region: Secondary | ICD-10-CM | POA: Diagnosis not present

## 2021-11-13 DIAGNOSIS — M79671 Pain in right foot: Secondary | ICD-10-CM | POA: Diagnosis not present

## 2021-11-13 DIAGNOSIS — M79672 Pain in left foot: Secondary | ICD-10-CM | POA: Diagnosis not present

## 2021-11-13 DIAGNOSIS — E039 Hypothyroidism, unspecified: Secondary | ICD-10-CM | POA: Diagnosis not present

## 2021-11-13 DIAGNOSIS — K5901 Slow transit constipation: Secondary | ICD-10-CM | POA: Diagnosis not present

## 2021-11-24 DIAGNOSIS — M67872 Other specified disorders of synovium, left ankle and foot: Secondary | ICD-10-CM | POA: Diagnosis not present

## 2021-11-24 DIAGNOSIS — M67871 Other specified disorders of synovium, right ankle and foot: Secondary | ICD-10-CM | POA: Diagnosis not present

## 2021-12-04 DIAGNOSIS — M65331 Trigger finger, right middle finger: Secondary | ICD-10-CM | POA: Diagnosis not present

## 2021-12-04 DIAGNOSIS — M13849 Other specified arthritis, unspecified hand: Secondary | ICD-10-CM | POA: Diagnosis not present

## 2021-12-04 DIAGNOSIS — M79644 Pain in right finger(s): Secondary | ICD-10-CM | POA: Diagnosis not present

## 2021-12-04 DIAGNOSIS — M1811 Unilateral primary osteoarthritis of first carpometacarpal joint, right hand: Secondary | ICD-10-CM | POA: Diagnosis not present

## 2021-12-04 DIAGNOSIS — G5601 Carpal tunnel syndrome, right upper limb: Secondary | ICD-10-CM | POA: Diagnosis not present

## 2021-12-04 DIAGNOSIS — M79641 Pain in right hand: Secondary | ICD-10-CM | POA: Diagnosis not present

## 2021-12-08 DIAGNOSIS — M67872 Other specified disorders of synovium, left ankle and foot: Secondary | ICD-10-CM | POA: Diagnosis not present

## 2021-12-08 DIAGNOSIS — M67871 Other specified disorders of synovium, right ankle and foot: Secondary | ICD-10-CM | POA: Diagnosis not present

## 2021-12-10 DIAGNOSIS — M67872 Other specified disorders of synovium, left ankle and foot: Secondary | ICD-10-CM | POA: Diagnosis not present

## 2021-12-10 DIAGNOSIS — M67871 Other specified disorders of synovium, right ankle and foot: Secondary | ICD-10-CM | POA: Diagnosis not present

## 2021-12-15 DIAGNOSIS — M67871 Other specified disorders of synovium, right ankle and foot: Secondary | ICD-10-CM | POA: Diagnosis not present

## 2021-12-15 DIAGNOSIS — M67872 Other specified disorders of synovium, left ankle and foot: Secondary | ICD-10-CM | POA: Diagnosis not present

## 2021-12-18 DIAGNOSIS — M67872 Other specified disorders of synovium, left ankle and foot: Secondary | ICD-10-CM | POA: Diagnosis not present

## 2021-12-18 DIAGNOSIS — M67871 Other specified disorders of synovium, right ankle and foot: Secondary | ICD-10-CM | POA: Diagnosis not present

## 2021-12-22 DIAGNOSIS — M67872 Other specified disorders of synovium, left ankle and foot: Secondary | ICD-10-CM | POA: Diagnosis not present

## 2021-12-22 DIAGNOSIS — M67871 Other specified disorders of synovium, right ankle and foot: Secondary | ICD-10-CM | POA: Diagnosis not present

## 2021-12-25 DIAGNOSIS — M67871 Other specified disorders of synovium, right ankle and foot: Secondary | ICD-10-CM | POA: Diagnosis not present

## 2021-12-25 DIAGNOSIS — M67872 Other specified disorders of synovium, left ankle and foot: Secondary | ICD-10-CM | POA: Diagnosis not present

## 2021-12-29 DIAGNOSIS — M67872 Other specified disorders of synovium, left ankle and foot: Secondary | ICD-10-CM | POA: Diagnosis not present

## 2021-12-29 DIAGNOSIS — M67871 Other specified disorders of synovium, right ankle and foot: Secondary | ICD-10-CM | POA: Diagnosis not present

## 2022-01-14 DIAGNOSIS — M67871 Other specified disorders of synovium, right ankle and foot: Secondary | ICD-10-CM | POA: Diagnosis not present

## 2022-01-14 DIAGNOSIS — M67872 Other specified disorders of synovium, left ankle and foot: Secondary | ICD-10-CM | POA: Diagnosis not present

## 2022-01-25 IMAGING — MG MM DIGITAL SCREENING BILAT W/ TOMO AND CAD
8 series · 9 of 24 positions shown · non-contrast
Comparison: Previous exam(s).

CLINICAL DATA: Screening.

EXAM:
DIGITAL SCREENING BILATERAL MAMMOGRAM WITH TOMOSYNTHESIS AND CAD
TECHNIQUE: Bilateral screening digital craniocaudal and mediolateral oblique
mammograms were obtained. Bilateral screening digital breast
tomosynthesis was performed. The images were evaluated with
computer-aided detection.

[L CC synth-2D]
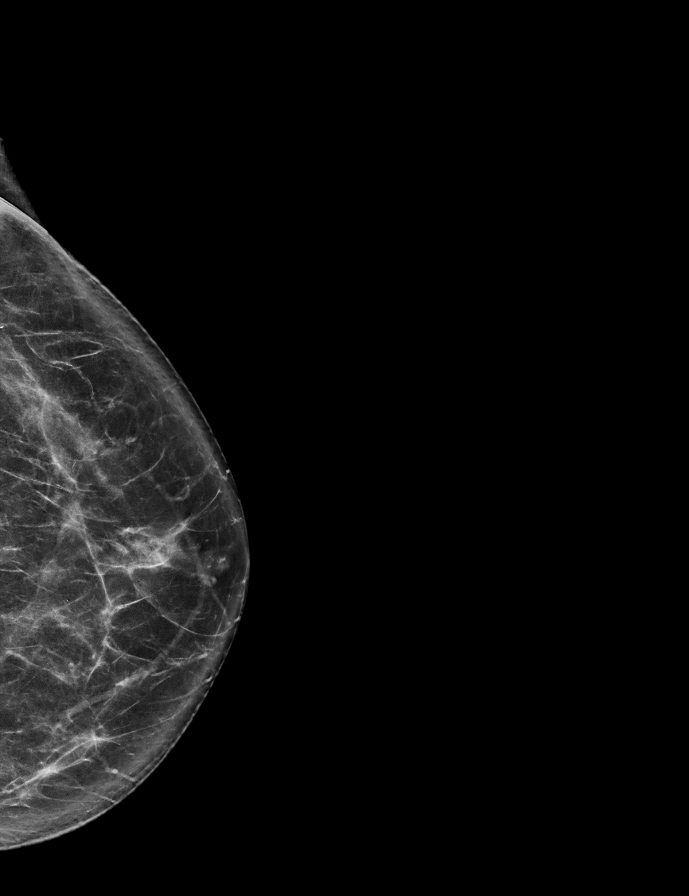

[R MLO synth-2D]
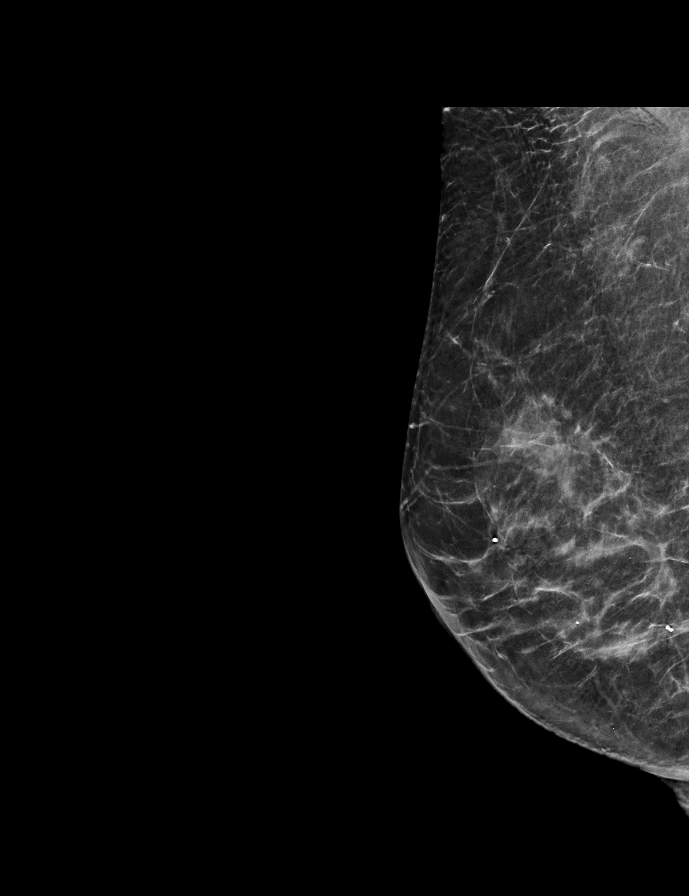

[R CC synth-2D]
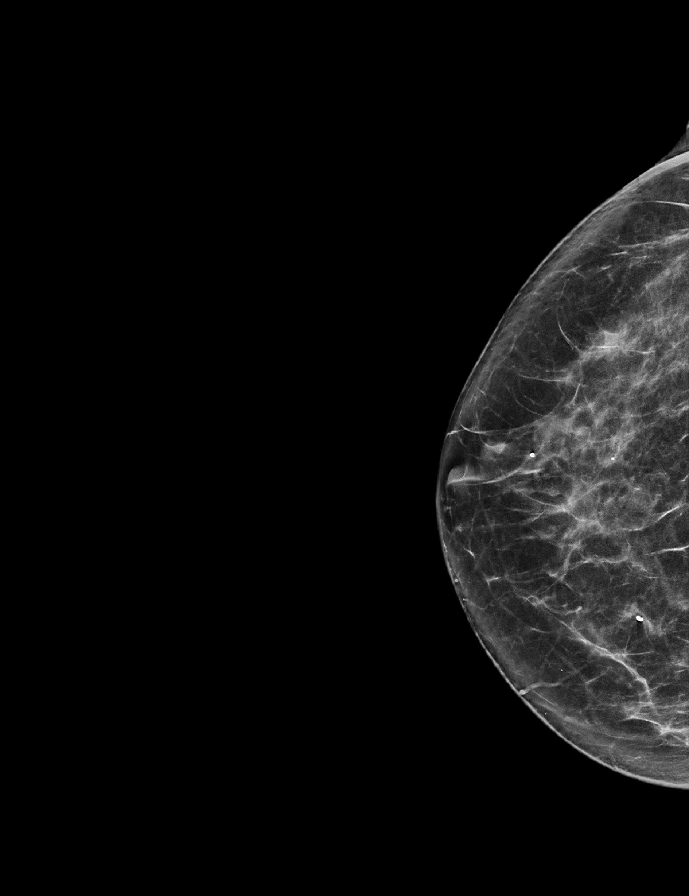

[L MLO synth-2D]
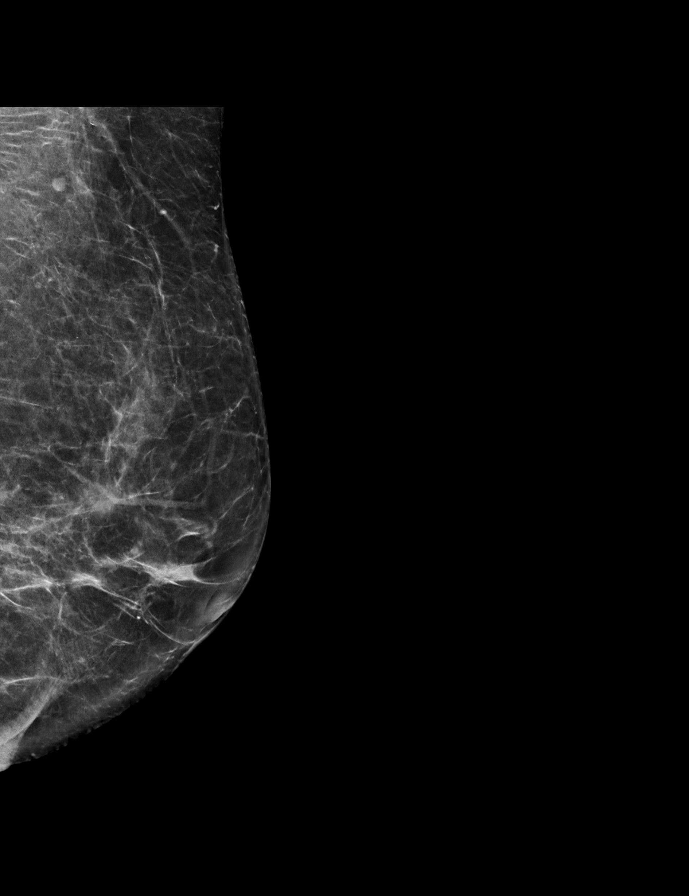

[L MLO tomo · 2 of 67 frames shown]
[frame 22/67]
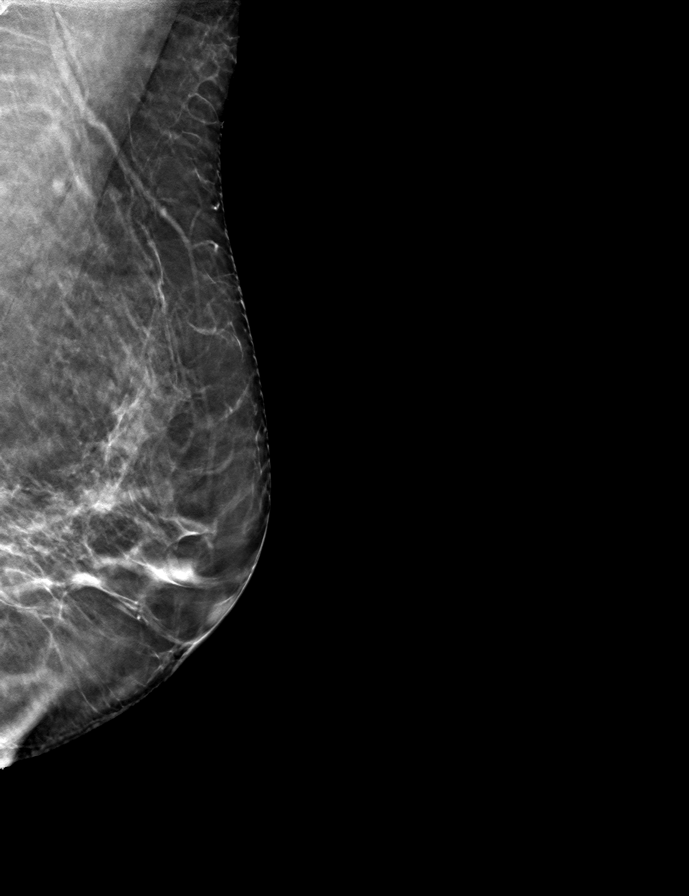
[frame 34/67]
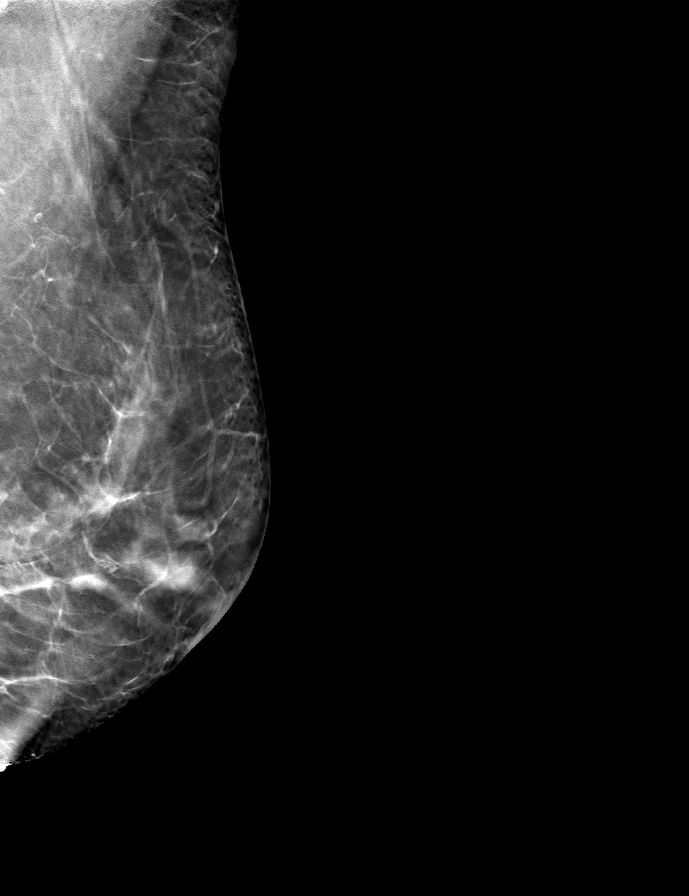

[R MLO tomo · tomo slice 31/61.0]
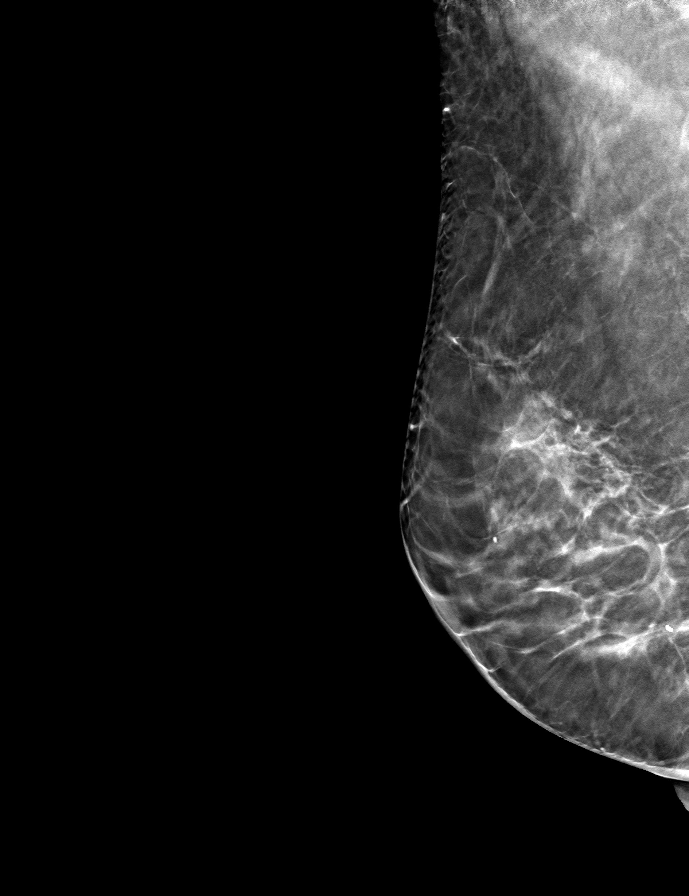

[R CC tomo · tomo slice 33/65.0]
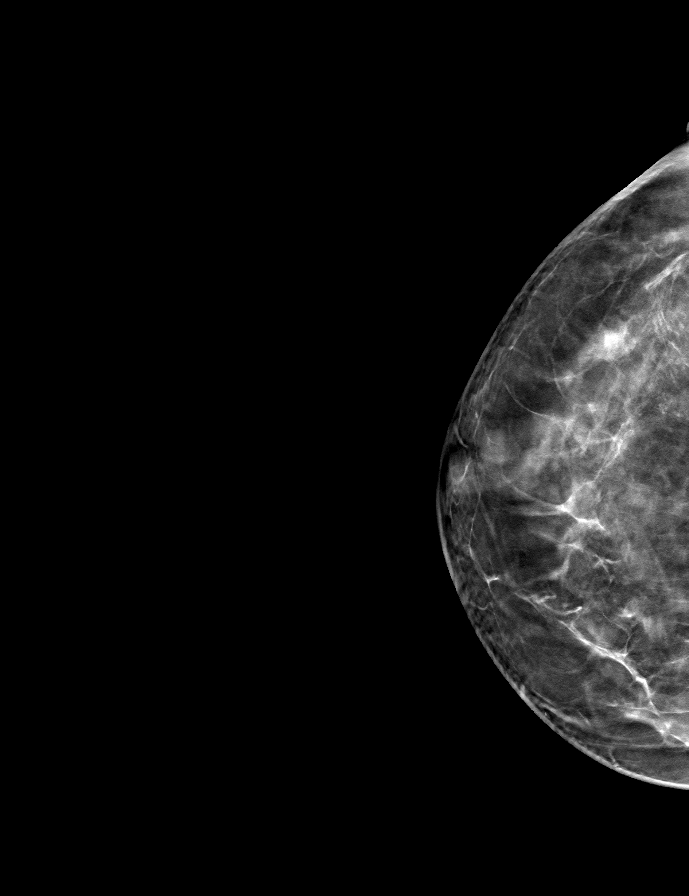

[L CC tomo · tomo slice 35/69.0]
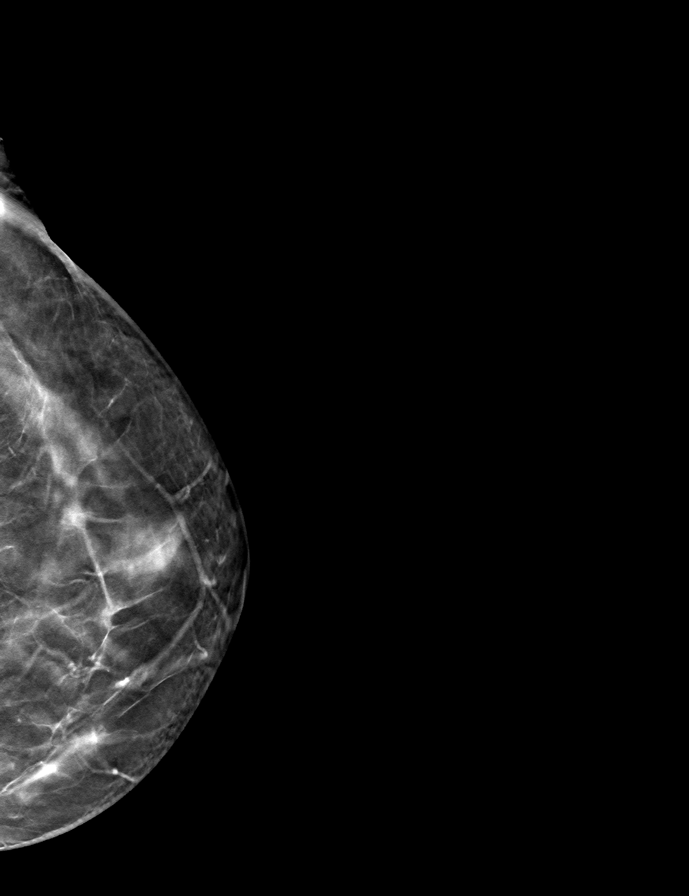

[9 of 24 positions shown; findings below may reference images not displayed]

ACR Breast Density Category b: There are scattered areas of
fibroglandular density.
FINDINGS: There are no findings suspicious for malignancy.
IMPRESSION: No mammographic evidence of malignancy. A result letter of this
screening mammogram will be mailed directly to the patient.

RECOMMENDATION:
Screening mammogram in one year. (Code:51-O-LD2)

BI-RADS CATEGORY  1: Negative.

## 2022-03-25 DIAGNOSIS — M9901 Segmental and somatic dysfunction of cervical region: Secondary | ICD-10-CM | POA: Diagnosis not present

## 2022-03-25 DIAGNOSIS — M9903 Segmental and somatic dysfunction of lumbar region: Secondary | ICD-10-CM | POA: Diagnosis not present

## 2022-03-25 DIAGNOSIS — M9904 Segmental and somatic dysfunction of sacral region: Secondary | ICD-10-CM | POA: Diagnosis not present

## 2022-03-25 DIAGNOSIS — M542 Cervicalgia: Secondary | ICD-10-CM | POA: Diagnosis not present
